# Patient Record
Sex: Female | Born: 1970
Health system: Southern US, Community
[De-identification: ages and names within clinical notes are randomized; demographics above are authoritative.]

## PROBLEM LIST (undated history)

## (undated) DIAGNOSIS — F419 Anxiety disorder, unspecified: Secondary | ICD-10-CM

## (undated) DIAGNOSIS — E119 Type 2 diabetes mellitus without complications: Secondary | ICD-10-CM

## (undated) DIAGNOSIS — E079 Disorder of thyroid, unspecified: Secondary | ICD-10-CM

## (undated) DIAGNOSIS — G43909 Migraine, unspecified, not intractable, without status migrainosus: Secondary | ICD-10-CM

## (undated) DIAGNOSIS — F32A Depression, unspecified: Secondary | ICD-10-CM

## (undated) DIAGNOSIS — M1990 Unspecified osteoarthritis, unspecified site: Secondary | ICD-10-CM

## (undated) DIAGNOSIS — B009 Herpesviral infection, unspecified: Secondary | ICD-10-CM

## (undated) DIAGNOSIS — F319 Bipolar disorder, unspecified: Secondary | ICD-10-CM

## (undated) DIAGNOSIS — K7460 Unspecified cirrhosis of liver: Secondary | ICD-10-CM

## (undated) DIAGNOSIS — J45909 Unspecified asthma, uncomplicated: Secondary | ICD-10-CM

## (undated) DIAGNOSIS — K7581 Nonalcoholic steatohepatitis (NASH): Secondary | ICD-10-CM

## (undated) DIAGNOSIS — F4323 Adjustment disorder with mixed anxiety and depressed mood: Secondary | ICD-10-CM

## (undated) HISTORY — PX: RIGHT OOPHORECTOMY: SHX2359

## (undated) HISTORY — DX: Unspecified asthma, uncomplicated: J45.909

## (undated) HISTORY — PX: OTHER SURGICAL HISTORY: SHX169

## (undated) HISTORY — DX: Migraine, unspecified, not intractable, without status migrainosus: G43.909

## (undated) HISTORY — PX: ABDOMINAL SURGERY: SHX537

## (undated) HISTORY — DX: Depression, unspecified: F32.A

## (undated) HISTORY — DX: Unspecified osteoarthritis, unspecified site: M19.90

## (undated) HISTORY — PX: CHOLECYSTECTOMY: SHX55

---

## 1998-04-07 ENCOUNTER — Emergency Department (HOSPITAL_COMMUNITY): Admission: EM | Admit: 1998-04-07 | Discharge: 1998-04-07 | Payer: Self-pay

## 1999-01-11 ENCOUNTER — Other Ambulatory Visit: Admission: RE | Admit: 1999-01-11 | Discharge: 1999-01-11 | Payer: Self-pay | Admitting: Obstetrics and Gynecology

## 1999-02-16 ENCOUNTER — Other Ambulatory Visit: Admission: RE | Admit: 1999-02-16 | Discharge: 1999-02-16 | Payer: Self-pay | Admitting: Obstetrics and Gynecology

## 1999-06-29 ENCOUNTER — Other Ambulatory Visit: Admission: RE | Admit: 1999-06-29 | Discharge: 1999-06-29 | Payer: Self-pay | Admitting: Obstetrics and Gynecology

## 1999-11-30 ENCOUNTER — Other Ambulatory Visit: Admission: RE | Admit: 1999-11-30 | Discharge: 1999-11-30 | Payer: Self-pay | Admitting: Obstetrics and Gynecology

## 2000-02-15 ENCOUNTER — Inpatient Hospital Stay (HOSPITAL_COMMUNITY): Admission: AD | Admit: 2000-02-15 | Discharge: 2000-02-15 | Payer: Self-pay | Admitting: Obstetrics and Gynecology

## 2000-02-20 ENCOUNTER — Inpatient Hospital Stay (HOSPITAL_COMMUNITY): Admission: AD | Admit: 2000-02-20 | Discharge: 2000-02-20 | Payer: Self-pay | Admitting: *Deleted

## 2000-03-14 ENCOUNTER — Inpatient Hospital Stay (HOSPITAL_COMMUNITY): Admission: AD | Admit: 2000-03-14 | Discharge: 2000-03-14 | Payer: Self-pay | Admitting: Obstetrics and Gynecology

## 2000-03-15 ENCOUNTER — Inpatient Hospital Stay (HOSPITAL_COMMUNITY): Admission: AD | Admit: 2000-03-15 | Discharge: 2000-03-15 | Payer: Self-pay | Admitting: Obstetrics and Gynecology

## 2000-03-22 ENCOUNTER — Inpatient Hospital Stay (HOSPITAL_COMMUNITY): Admission: AD | Admit: 2000-03-22 | Discharge: 2000-03-22 | Payer: Self-pay | Admitting: Obstetrics and Gynecology

## 2000-03-27 ENCOUNTER — Inpatient Hospital Stay (HOSPITAL_COMMUNITY): Admission: AD | Admit: 2000-03-27 | Discharge: 2000-04-02 | Payer: Self-pay | Admitting: Obstetrics and Gynecology

## 2000-03-27 ENCOUNTER — Encounter (INDEPENDENT_AMBULATORY_CARE_PROVIDER_SITE_OTHER): Payer: Self-pay | Admitting: Specialist

## 2000-03-28 ENCOUNTER — Encounter: Payer: Self-pay | Admitting: Obstetrics and Gynecology

## 2000-05-10 ENCOUNTER — Other Ambulatory Visit: Admission: RE | Admit: 2000-05-10 | Discharge: 2000-05-10 | Payer: Self-pay | Admitting: Obstetrics and Gynecology

## 2000-11-04 ENCOUNTER — Other Ambulatory Visit: Admission: RE | Admit: 2000-11-04 | Discharge: 2000-11-04 | Payer: Self-pay | Admitting: Obstetrics and Gynecology

## 2000-11-26 ENCOUNTER — Encounter: Admission: RE | Admit: 2000-11-26 | Discharge: 2001-02-24 | Payer: Self-pay | Admitting: Family Medicine

## 2003-09-12 ENCOUNTER — Emergency Department (HOSPITAL_COMMUNITY): Admission: EM | Admit: 2003-09-12 | Discharge: 2003-09-12 | Payer: Self-pay | Admitting: Emergency Medicine

## 2004-03-27 ENCOUNTER — Emergency Department (HOSPITAL_COMMUNITY): Admission: EM | Admit: 2004-03-27 | Discharge: 2004-03-28 | Payer: Self-pay | Admitting: Emergency Medicine

## 2004-03-30 ENCOUNTER — Emergency Department (HOSPITAL_COMMUNITY): Admission: EM | Admit: 2004-03-30 | Discharge: 2004-03-30 | Payer: Self-pay | Admitting: Emergency Medicine

## 2004-11-16 ENCOUNTER — Encounter: Admission: RE | Admit: 2004-11-16 | Discharge: 2004-11-16 | Payer: Self-pay | Admitting: General Surgery

## 2005-01-18 ENCOUNTER — Ambulatory Visit (HOSPITAL_COMMUNITY): Admission: RE | Admit: 2005-01-18 | Discharge: 2005-01-18 | Payer: Self-pay | Admitting: Obstetrics and Gynecology

## 2005-02-05 ENCOUNTER — Ambulatory Visit (HOSPITAL_COMMUNITY): Admission: RE | Admit: 2005-02-05 | Discharge: 2005-02-05 | Payer: Self-pay | Admitting: Obstetrics and Gynecology

## 2005-05-10 ENCOUNTER — Ambulatory Visit (HOSPITAL_COMMUNITY): Admission: RE | Admit: 2005-05-10 | Discharge: 2005-05-10 | Payer: Self-pay | Admitting: Obstetrics and Gynecology

## 2005-07-11 ENCOUNTER — Encounter: Admission: RE | Admit: 2005-07-11 | Discharge: 2005-07-11 | Payer: Self-pay | Admitting: Internal Medicine

## 2005-07-26 ENCOUNTER — Ambulatory Visit (HOSPITAL_COMMUNITY): Admission: RE | Admit: 2005-07-26 | Discharge: 2005-07-26 | Payer: Self-pay | Admitting: Obstetrics and Gynecology

## 2006-01-14 ENCOUNTER — Ambulatory Visit (HOSPITAL_COMMUNITY): Admission: RE | Admit: 2006-01-14 | Discharge: 2006-01-14 | Payer: Self-pay | Admitting: Obstetrics and Gynecology

## 2008-12-09 ENCOUNTER — Emergency Department (HOSPITAL_BASED_OUTPATIENT_CLINIC_OR_DEPARTMENT_OTHER): Admission: EM | Admit: 2008-12-09 | Discharge: 2008-12-09 | Payer: Self-pay | Admitting: Emergency Medicine

## 2008-12-09 ENCOUNTER — Ambulatory Visit: Payer: Self-pay | Admitting: Interventional Radiology

## 2009-07-23 ENCOUNTER — Emergency Department (HOSPITAL_BASED_OUTPATIENT_CLINIC_OR_DEPARTMENT_OTHER): Admission: EM | Admit: 2009-07-23 | Discharge: 2009-07-23 | Payer: Self-pay | Admitting: Emergency Medicine

## 2009-07-24 ENCOUNTER — Inpatient Hospital Stay (HOSPITAL_COMMUNITY): Admission: AD | Admit: 2009-07-24 | Discharge: 2009-07-27 | Payer: Self-pay | Admitting: Obstetrics & Gynecology

## 2009-07-24 ENCOUNTER — Other Ambulatory Visit: Payer: Self-pay | Admitting: Emergency Medicine

## 2010-11-26 ENCOUNTER — Encounter: Payer: Self-pay | Admitting: Obstetrics and Gynecology

## 2011-02-09 LAB — DIFFERENTIAL
Basophils Absolute: 0 10*3/uL (ref 0.0–0.1)
Basophils Relative: 0 % (ref 0–1)
Eosinophils Absolute: 0.2 10*3/uL (ref 0.0–0.7)
Lymphocytes Relative: 30 % (ref 12–46)
Lymphs Abs: 2.2 10*3/uL (ref 0.7–4.0)
Monocytes Absolute: 0.4 10*3/uL (ref 0.1–1.0)
Neutrophils Relative %: 65 % (ref 43–77)

## 2011-02-09 LAB — BASIC METABOLIC PANEL
CO2: 29 mEq/L (ref 19–32)
Chloride: 103 mEq/L (ref 96–112)
Sodium: 140 mEq/L (ref 135–145)

## 2011-02-09 LAB — GLUCOSE, CAPILLARY: Glucose-Capillary: 108 mg/dL — ABNORMAL HIGH (ref 70–99)

## 2011-02-09 LAB — CULTURE, BLOOD (ROUTINE X 2): Culture: NO GROWTH

## 2011-02-09 LAB — CBC
Hemoglobin: 12.6 g/dL (ref 12.0–15.0)
MCHC: 33.9 g/dL (ref 30.0–36.0)
MCV: 87.4 fL (ref 78.0–100.0)
RBC: 4.13 MIL/uL (ref 3.87–5.11)
RBC: 4.44 MIL/uL (ref 3.87–5.11)
WBC: 7.3 10*3/uL (ref 4.0–10.5)
WBC: 8.2 10*3/uL (ref 4.0–10.5)

## 2011-02-09 LAB — WET PREP, GENITAL: Trich, Wet Prep: NONE SEEN

## 2011-02-09 LAB — WOUND CULTURE

## 2011-02-09 LAB — GLUCOSE, RANDOM: Glucose, Bld: 173 mg/dL — ABNORMAL HIGH (ref 70–99)

## 2011-02-09 LAB — GC/CHLAMYDIA PROBE AMP, GENITAL: Chlamydia, DNA Probe: NEGATIVE

## 2011-03-23 NOTE — Op Note (Signed)
[facility]  Patient:    Nancy Mills                     MRN: [id number] Proc. Date: 03/30/00 Adm. Date:  [id number] Attending:  Maxie Better                           Operative Report  PREOPERATIVE DIAGNOSIS:       Intrauterine pregnancy at 47+ weeks gestational age, failure to progress, prolonged rupture of membranes.  POSTOPERATIVE DIAGNOSIS:      Intrauterine pregnancy at 40+ weeks gestational age, failure to progress, prolonged rupture of membranes.  OPERATION:                    Primary low transverse cesarean section.  SURGEON:                      Sung Amabile. Roslyn Smiling, M.D.  ASSISTANT:                    Cordelia Pen A. Rosalio Macadamia, M.D.  ANESTHESIA:                   Spinal.  ESTIMATED BLOOD LOSS:         800 cc.  TUBES AND DRAINS:             Foley.  COMPLICATIONS:                None.  FINDINGS:                     Live, vertex female, transverse position, 3085 grams with Apgars of 8 and 9.  Caput molded through 3 cm cervix.  Head out of pelvis.  Lower uterine segment very thin.  Uterus otherwise normal.  Normal tubes and ovaries.  Prolonged uterine atony was encountered after delivery.  This was managed with uterine massage, IV Pitocin, IM Methergine, and two doses of IM Hemabate.  Extension of the uterine incision measuring 3 cm at 5 oclock was repaired separately.  SPECIMENS:                    Cord bloods.  Arterial cord pH 7.31, and placenta to pathology.  INDICATIONS:                  A 40 year old woman, gravida 2, para 0-0-1-0, admitted at 36+ weeks gestational age after oligohydramnios was identified. The cervix was quite unfavorable.  Various methods to try to ripen the cervix were sed over her more than 48-hour stay on labor and delivery.  Membranes ruptured spontaneously at 8:30 p.m. on Mar 28, 2000.  Cervidil was used to try to ripen he cervix thereafter.  Pitocin was initiated on the afternoon of May 25.   Regular contractions were achieved on the morning of May 26.  Because of difficulty monitoring uterine contraction frequency, intrauterine pressure transducer was inserted.  An elevated uterine resting tone was identified while Pitocin was being given.  The Pitocin was stopped and the resting tone dropped to around 20 mmHg. It was restarted and the resting uterine tone rose again to 60 mmHg.  Labor was not adequate in terms of [place] units, however, with the unacceptably high elevation in uterine tone, the decision was made to proceed with cesarean section for delivery for arrest of labor progression.  The patient was consented and benefits,  risks, options, and expected outcomes were discussed prior to surgery.  Questions were answered and consent was obtained.  DESCRIPTION OF PROCEDURE:     After the establishment of adequate spinal anesthesia, the patient was placed in the left uterine displacement position. he abdomen was prepped, Foley catheter was placed, and the patient was draped.  A Pfannenstiel incision was made on the skin with a knife.  The incision was carried to the level of the fascia with a knife.  The fascia was nicked in the midline nd the fascial incision was carried laterally using scissors.  The fascia was separated from the underlying rectus muscle bellies with a combination of blunt and sharp dissection.  The rectus muscle bellies were split in the midline sharply.  The peritoneum was visualized, grasped in a clear space, and entered there bluntly. The peritoneal incision was carried superiorly and inferiorly taking care to avoid injury to underlying viscera.  The bladder blade was placed.  Uterine position as ascertained.  The lower uterine segment was very very thin.  The vesicouterine serosa was opened over the lower uterine segment transversely.  The bladder flap was developed bluntly and the bladder blade was placed in the bladder flap.  A low  transverse incision was made on the uterus with a knife.  The Metzenbaum scissors were used to complete the incision.  Bandage scissors were used to carry the incision laterally.  The head was delivered without difficulty and the nasopharynx suctioned.  The baby was delivered and the cord was clamped and cut. The baby was handed off the table to the awaiting pediatricians.  Cord bloods and arterial cord pH were taken.  The placenta was removed spontaneously.  Significant uterine atony was encountered.  The uterus was exteriorized, the bladder blade was replaced.  The uterine massage was being applied.  Increased IV Pitocin was given and Methergine was given.  The uterus as closed in layers.  The extension at 5 oclock was closed with a running interlocking stitch of 0 Monocryl.  The first layer of the main portion of the uterine incision was closed with a running interlocking stitch of 0 Monocryl and a second layer was closed with imbricating stitch of 0 Monocryl.  Small bleeding areas were controlled with figure-of-eight sutures of 0 Monocryl.  Massage continued.  Hemabate x 2 was given.  The uterus finally became firm and was replaced into the abdominal cavity.  Hemostasis was noted.  The peritoneal cavity was irrigated with warm normal saline.  Initial sponge, needle, and instrument counts were correct.  The anterior abdominal wall was closed in layers.  The subfascial layer was irrigated with warm normal saline.  Electrocautery was used for hemostasis.  The fascia was closed from either angle with a running stitch f 0 Vicryl tying in the midline independently.  The fat was irrigated with warm normal saline.  Electrocautery was used for hemostasis.  The skin was infiltrated with 20 cc of 0.25% Marcaine.  The skin was closed with staples.  Final sponge, needle, and instrument counts were correct.  The urine was clear at the end of the case. The patient was transported to the  recovery room in satisfactory condition. DD:  03/30/00 TD:  04/02/00 Job: [id number]

## 2011-03-23 NOTE — Discharge Summary (Signed)
[facility]  Patient:    Nancy Mills, Nancy Mills                     MRN: [id number] Adm. Date:  [id number] Disc. Date: [id number] Attending:  Maxie Better                           Discharge Summary  DISCHARGE DIAGNOSES:          1. Intrauterine pregnancy at term, delivered.                               2. Prolonged rupture of membranes.                               3. Failure to progress.                               4. Uterine atony.                               5. Postoperative anemia.                               6. History of hypothyroidism.                               7. Rh negative (RhoGAM given postpartum).  PROCEDURE:                    Primary low transverse cesarean section on Mar 30, 2000.  Pitocin augmentation.  Cervidil and prostaglandin gel.  HISTORY OF PRESENT ILLNESS:   A 40 year old woman, gravida 2, para 0-0-1-0, at [redacted] weeks gestational age admitted secondary to variable decellerations on tracing.  She was sent to [facility] [place] because of decreased fetal activity and bloody discharge.  On reviewing the tracing, it was unclear if the baseline was 155 with decellerations to 120.  Because of this and because of the issue of decreased fetal activity, the decision was made to admit and induce.  HOSPITAL COURSE:              Pitocin was initiated on Mar 28, 2000.  Little cervical change occurred and labor could not be established.  Cervical ripening was initiated on the night of May 24.  Pitocin was again initiated.  No labor occurred.  Cervidil was used to try to ripen the cervix. Rupture of membranes occurred and Clindamycin was initiated after more than 12 hours of rupture of membranes. High dose Pitocin was started late in the afternoon of May 25.  Intrauterine pressure transducer was inserted on the morning of May 26, to enhance the ability to contraction adequacy.  The cervix at that time was 2 to 3  cm.  Baseline uterine  tone was elevated.  Pitocin was stopped and contractions abated.  Pitocin was restarted and elevated tone was again noted.  Because of the inability to initiate adequate contraction pattern without increasing the uterine tone to an unacceptable level, the decision was made to proceed with cesarean delivery.  The patient was given consent and taken to the  operating room where she underwent primary low transverse cesarean section by Sung Amabile. Roslyn Smiling, M.D. and Cordelia Pen A. Rosalio Macadamia, M.D. under spinal anesthesia.  Estimated blood loss was 800 cc.  The  baby was a live vertex female, transverse position, 3085 grams with Apgars of 8 and 9.  The head was molded to the cervix.  The lower uterine segment was very thin. An extension of the incision at 5 oclock was repaired without difficulty. Arterial cord pH was 7.31 and the placenta was sent to pathology.  It later revealed immature placenta.  The patients postoperative course was remarkable for anemia.  Hemoglobin stabilized at 8.0.  This was well tolerated and treated conservatively.  RhoGAM  workup was performed.  The baby was O positive and the patient received RhoGAM.  Diet was advanced without difficulty and surgical staples were removed prior to  discharge.  She was given routine status post cesarean section instructions.  DISCHARGE MEDICATIONS:        1. Prenatal vitamins.                               2. Tylox tablets.                               3. Nonsteroidal anti-inflammatory agents.  FOLLOW-UP:                    She will be followed up in the office in four to ix weeks. DD:  05/03/00 TD:  05/04/00 Job: [id number]

## 2011-05-26 ENCOUNTER — Emergency Department (HOSPITAL_COMMUNITY): Payer: Medicaid Other

## 2011-05-26 ENCOUNTER — Emergency Department (HOSPITAL_COMMUNITY)
Admission: EM | Admit: 2011-05-26 | Discharge: 2011-05-26 | Disposition: A | Discharge status: Home / Self Care | Payer: Medicaid Other | Attending: Emergency Medicine | Admitting: Emergency Medicine

## 2011-05-26 DIAGNOSIS — R072 Precordial pain: Secondary | ICD-10-CM | POA: Insufficient documentation

## 2011-05-26 DIAGNOSIS — T190XXA Foreign body in urethra, initial encounter: Secondary | ICD-10-CM | POA: Insufficient documentation

## 2011-05-26 DIAGNOSIS — F319 Bipolar disorder, unspecified: Secondary | ICD-10-CM | POA: Insufficient documentation

## 2011-05-26 DIAGNOSIS — E669 Obesity, unspecified: Secondary | ICD-10-CM | POA: Insufficient documentation

## 2011-05-26 DIAGNOSIS — M546 Pain in thoracic spine: Secondary | ICD-10-CM | POA: Insufficient documentation

## 2011-05-26 DIAGNOSIS — E039 Hypothyroidism, unspecified: Secondary | ICD-10-CM | POA: Insufficient documentation

## 2012-10-24 ENCOUNTER — Encounter (HOSPITAL_BASED_OUTPATIENT_CLINIC_OR_DEPARTMENT_OTHER): Payer: Self-pay | Admitting: Family Medicine

## 2012-10-24 ENCOUNTER — Emergency Department (HOSPITAL_BASED_OUTPATIENT_CLINIC_OR_DEPARTMENT_OTHER)
Admission: EM | Admit: 2012-10-24 | Discharge: 2012-10-24 | Disposition: A | Discharge status: Home / Self Care | Payer: BC Managed Care – PPO | Attending: Emergency Medicine | Admitting: Emergency Medicine

## 2012-10-24 DIAGNOSIS — R6883 Chills (without fever): Secondary | ICD-10-CM | POA: Insufficient documentation

## 2012-10-24 DIAGNOSIS — K5289 Other specified noninfective gastroenteritis and colitis: Secondary | ICD-10-CM | POA: Insufficient documentation

## 2012-10-24 DIAGNOSIS — R1110 Vomiting, unspecified: Secondary | ICD-10-CM | POA: Insufficient documentation

## 2012-10-24 DIAGNOSIS — E079 Disorder of thyroid, unspecified: Secondary | ICD-10-CM | POA: Insufficient documentation

## 2012-10-24 DIAGNOSIS — F411 Generalized anxiety disorder: Secondary | ICD-10-CM | POA: Insufficient documentation

## 2012-10-24 DIAGNOSIS — Z79899 Other long term (current) drug therapy: Secondary | ICD-10-CM | POA: Insufficient documentation

## 2012-10-24 DIAGNOSIS — K529 Noninfective gastroenteritis and colitis, unspecified: Secondary | ICD-10-CM

## 2012-10-24 DIAGNOSIS — B009 Herpesviral infection, unspecified: Secondary | ICD-10-CM | POA: Insufficient documentation

## 2012-10-24 DIAGNOSIS — Z87891 Personal history of nicotine dependence: Secondary | ICD-10-CM | POA: Insufficient documentation

## 2012-10-24 DIAGNOSIS — F319 Bipolar disorder, unspecified: Secondary | ICD-10-CM | POA: Insufficient documentation

## 2012-10-24 HISTORY — DX: Disorder of thyroid, unspecified: E07.9

## 2012-10-24 HISTORY — DX: Anxiety disorder, unspecified: F41.9

## 2012-10-24 HISTORY — DX: Herpesviral infection, unspecified: B00.9

## 2012-10-24 HISTORY — DX: Bipolar disorder, unspecified: F31.9

## 2012-10-24 MED ORDER — ONDANSETRON HCL 4 MG PO TABS: 4.0000 mg | ORAL_TABLET | Freq: Four times a day (QID) | ORAL | Status: DC

## 2012-10-24 MED ORDER — ONDANSETRON HCL 4 MG/2ML IJ SOLN
4.0000 mg | Freq: Once | INTRAMUSCULAR | Status: AC
  Administered 2012-10-24: 4 mg via INTRAVENOUS
  Filled 2012-10-24: qty 2

## 2012-10-24 MED ORDER — SODIUM CHLORIDE 0.9 % IV BOLUS (SEPSIS)
1000.0000 mL | Freq: Once | INTRAVENOUS | Status: AC
  Administered 2012-10-24: 1000 mL via INTRAVENOUS

## 2012-10-24 NOTE — ED Notes (Signed)
Hold tubes for labs drawn

## 2012-10-24 NOTE — ED Notes (Signed)
Pt c/o n/v/d since 5pm yesterday. Husband sick prior. Denies pain, fever.

## 2012-10-24 NOTE — ED Provider Notes (Addendum)
History     CSN: [id number]  Arrival date & time 10/24/12  1191   First MD Initiated Contact with Patient 10/24/12 [phone]      Chief Complaint  Patient presents with  . Emesis  . Diarrhea    (Consider location/radiation/quality/duration/timing/severity/associated sxs/prior treatment) Patient is a 41 y.o. female presenting with vomiting and diarrhea. The history is provided by the patient.  Emesis  This is a new problem. The current episode started 12 to 24 hours ago. The problem occurs more than 10 times per day. The problem has not changed since onset.The emesis has an appearance of stomach contents. There has been no fever. Associated symptoms include chills and diarrhea. Pertinent negatives include no abdominal pain, no cough, no fever and no URI. Risk factors include ill contacts (husband with same 1 day ago).  Diarrhea The primary symptoms include vomiting and diarrhea. Primary symptoms do not include fever, abdominal pain or dysuria.  The illness is also significant for chills.    Past Medical History  Diagnosis Date  . Bipolar 1 disorder   . Thyroid disease   . HSV-2 infection   . Anxiety     Past Surgical History  Procedure Date  . Abdominal surgery   . Abdomnal wall reconstruction   . Cholecystectomy   . Right oophorectomy     No family history on file.  History  Substance Use Topics  . Smoking status: Former Games developer  . Smokeless tobacco: Not on file  . Alcohol Use: Yes    OB History    Grav Para Term Preterm Abortions TAB SAB Ect Mult Living                  Review of Systems  Constitutional: Positive for chills. Negative for fever.  Respiratory: Negative for cough and shortness of breath.   Gastrointestinal: Positive for vomiting and diarrhea. Negative for abdominal pain and blood in stool.  Genitourinary: Negative for dysuria.  All other systems reviewed and are negative.    Allergies  Codeine; Erythromycin; and Penicillins  Home  Medications   Current Outpatient Rx  Name  Route  Sig  Dispense  Refill  . ALBUTEROL SULFATE HFA 108 (90 BASE) MCG/ACT IN AERS   Inhalation   Inhale 2 puffs into the lungs every 6 (six) hours as needed.         . ALPRAZOLAM 0.5 MG PO TABS   Oral   Take 0.5 mg by mouth 3 (three) times daily as needed.         Marland Kitchen LAMOTRIGINE 200 MG PO TABS   Oral   Take 300 mg by mouth daily.         Marland Kitchen LEVOTHYROXINE SODIUM 125 MCG PO CAPS   Oral   Take by mouth.         Marland Kitchen VALACYCLOVIR HCL 500 MG PO TABS   Oral   Take 1,000 mg by mouth daily.           BP 123/68  Pulse 100  Temp 98.5 F (36.9 C) (Oral)  Resp 14  Ht 5' 6.75" (1.695 m)  Wt 313 lb (141.976 kg)  BMI 49.39 kg/m2  SpO2 99%  Physical Exam  Nursing note and vitals reviewed. Constitutional: She is oriented to person, place, and time. She appears well-developed and well-nourished. No distress.  HENT:  Head: Normocephalic and atraumatic.  Mouth/Throat: Oropharynx is clear and moist.  Eyes: Conjunctivae normal and EOM are normal. Pupils are equal, round, and reactive  to light.  Neck: Normal range of motion. Neck supple.  Cardiovascular: Regular rhythm and intact distal pulses.  Tachycardia present.   No murmur heard. Pulmonary/Chest: Effort normal and breath sounds normal. No respiratory distress. She has no wheezes. She has no rales.  Abdominal: Soft. She exhibits no distension. There is no tenderness. There is no rebound and no guarding.       Multiple healed abd scars  Musculoskeletal: Normal range of motion. She exhibits no edema and no tenderness.  Neurological: She is alert and oriented to person, place, and time.  Skin: Skin is warm and dry. No rash noted. No erythema.  Psychiatric: She has a normal mood and affect. Her behavior is normal.    ED Course  Procedures (including critical care time)  Labs Reviewed - No data to display No results found.   1. Gastroenteritis       MDM   Pt with symptoms  most consistent with a viral process with fever/vomitting/diarrhea.  Denies bad food exposure and recent travel out of the country.  No recent abx.  No hx concerning for GU pathology or kidney stones.  Pt is awake and alert on exam without peritoneal signs.  9:14 AM Pt feeling much better and d/ced home.       Gwyneth Sprout, MD 10/24/12 [id number]  Gwyneth Sprout, MD 10/24/12 1308

## 2016-02-09 DIAGNOSIS — E042 Nontoxic multinodular goiter: Secondary | ICD-10-CM | POA: Insufficient documentation

## 2016-02-09 DIAGNOSIS — E039 Hypothyroidism, unspecified: Secondary | ICD-10-CM | POA: Insufficient documentation

## 2016-04-24 ENCOUNTER — Other Ambulatory Visit: Payer: Self-pay | Admitting: Physician Assistant

## 2016-04-24 DIAGNOSIS — R1030 Lower abdominal pain, unspecified: Secondary | ICD-10-CM

## 2016-04-30 ENCOUNTER — Other Ambulatory Visit: Payer: Medicaid Other

## 2016-05-09 DIAGNOSIS — K7460 Unspecified cirrhosis of liver: Secondary | ICD-10-CM | POA: Insufficient documentation

## 2016-05-09 DIAGNOSIS — K580 Irritable bowel syndrome with diarrhea: Secondary | ICD-10-CM | POA: Insufficient documentation

## 2016-05-09 DIAGNOSIS — K766 Portal hypertension: Secondary | ICD-10-CM | POA: Insufficient documentation

## 2017-01-01 ENCOUNTER — Emergency Department (HOSPITAL_BASED_OUTPATIENT_CLINIC_OR_DEPARTMENT_OTHER): Payer: PRIVATE HEALTH INSURANCE

## 2017-01-01 ENCOUNTER — Emergency Department (HOSPITAL_BASED_OUTPATIENT_CLINIC_OR_DEPARTMENT_OTHER)
Admission: EM | Admit: 2017-01-01 | Discharge: 2017-01-02 | Discharge status: Against Medical Advice | Payer: PRIVATE HEALTH INSURANCE | Attending: Emergency Medicine | Admitting: Emergency Medicine

## 2017-01-01 ENCOUNTER — Encounter (HOSPITAL_BASED_OUTPATIENT_CLINIC_OR_DEPARTMENT_OTHER): Payer: Self-pay | Admitting: Emergency Medicine

## 2017-01-01 DIAGNOSIS — Z79899 Other long term (current) drug therapy: Secondary | ICD-10-CM | POA: Insufficient documentation

## 2017-01-01 DIAGNOSIS — Z794 Long term (current) use of insulin: Secondary | ICD-10-CM | POA: Diagnosis not present

## 2017-01-01 DIAGNOSIS — R079 Chest pain, unspecified: Secondary | ICD-10-CM

## 2017-01-01 DIAGNOSIS — F17200 Nicotine dependence, unspecified, uncomplicated: Secondary | ICD-10-CM | POA: Diagnosis not present

## 2017-01-01 DIAGNOSIS — R51 Headache: Secondary | ICD-10-CM | POA: Insufficient documentation

## 2017-01-01 DIAGNOSIS — E119 Type 2 diabetes mellitus without complications: Secondary | ICD-10-CM | POA: Insufficient documentation

## 2017-01-01 DIAGNOSIS — R0602 Shortness of breath: Secondary | ICD-10-CM | POA: Insufficient documentation

## 2017-01-01 DIAGNOSIS — R072 Precordial pain: Secondary | ICD-10-CM | POA: Diagnosis not present

## 2017-01-01 HISTORY — DX: Morbid (severe) obesity due to excess calories: E66.01

## 2017-01-01 HISTORY — DX: Type 2 diabetes mellitus without complications: E11.9

## 2017-01-01 HISTORY — DX: Nonalcoholic steatohepatitis (NASH): K75.81

## 2017-01-01 HISTORY — DX: Adjustment disorder with mixed anxiety and depressed mood: F43.23

## 2017-01-01 HISTORY — DX: Unspecified cirrhosis of liver: K74.60

## 2017-01-01 LAB — CBC WITH DIFFERENTIAL/PLATELET
BASOS PCT: 0 %
Basophils Absolute: 0 10*3/uL (ref 0.0–0.1)
Eosinophils Absolute: 0.2 10*3/uL (ref 0.0–0.7)
Eosinophils Relative: 2 %
HEMATOCRIT: 38.5 % (ref 36.0–46.0)
Hemoglobin: 13 g/dL (ref 12.0–15.0)
LYMPHS PCT: 33 %
Lymphs Abs: 3.2 10*3/uL (ref 0.7–4.0)
MCH: 28.3 pg (ref 26.0–34.0)
MCHC: 33.8 g/dL (ref 30.0–36.0)
MCV: 83.9 fL (ref 78.0–100.0)
MONOS PCT: 6 %
Monocytes Absolute: 0.6 10*3/uL (ref 0.1–1.0)
NEUTROS ABS: 5.8 10*3/uL (ref 1.7–7.7)
NEUTROS PCT: 59 %
Platelets: 211 10*3/uL (ref 150–400)
RBC: 4.59 MIL/uL (ref 3.87–5.11)
RDW: 14.7 % (ref 11.5–15.5)
WBC: 9.7 10*3/uL (ref 4.0–10.5)

## 2017-01-01 MED ORDER — KETOROLAC TROMETHAMINE 30 MG/ML IJ SOLN: 30.0000 mg | Freq: Once | INTRAMUSCULAR | Status: DC

## 2017-01-01 MED ORDER — GI COCKTAIL ~~LOC~~
30.0000 mL | Freq: Once | ORAL | Status: AC
  Administered 2017-01-01: 30 mL via ORAL
  Filled 2017-01-01: qty 30

## 2017-01-01 NOTE — ED Provider Notes (Signed)
[facility] DEPT [facility] Provider Note   CSN: [id number] Arrival date & time: 01/01/17  2302   By signing my name below, I, Eunice Blase, attest that this documentation has been prepared under the direction and in the presence of Tjay Velazquez, MD. Electronically signed, Eunice Blase, ED Scribe. 01/01/17. 11:48 PM.   History   Chief Complaint Chief Complaint  Patient presents with  . Chest Pain  . Shortness of Breath   The history is provided by the patient and medical records. No language interpreter was used.  Chest Pain   This is a recurrent problem. The current episode started 3 to 5 hours ago. The problem occurs constantly. The problem has not changed since onset.The pain is associated with rest. The pain is present in the substernal region. The pain is moderate. The quality of the pain is described as burning. Associated symptoms include headaches. Pertinent negatives include no abdominal pain, no diaphoresis, no fever, no numbness, no palpitations, no shortness of breath, no vomiting and no weakness. Cough: dry. Risk factors include obesity.  Pertinent negatives for past medical history include no Marfan's syndrome.  Pertinent negatives for family medical history include: no early MI.  Procedure history is negative for stress echo.    HPI Comments: Nancy Mills is a 46 y.o. female who presents to the Emergency Department complaining of persistent chest pain x 4 hours. She also notes that her pain is exacerbated when leaning backward. She adds associated headache and facial pain. She states she was watching television at onset after eating spaghetti this evening. She notes Hx of similar relieved with xanax. Pt notes she has taken xanax today without relief. Last normal BM today. No leg pain or swelling. No long car trips or plane trips.    Past Medical History:  Diagnosis Date  . Adjustment disorder with mixed anxiety and depressed mood   . Anxiety   . Bipolar 1 disorder  ([place])   . Diabetes mellitus without complication ([place])   . HSV-2 infection   . Liver cirrhosis secondary to NASH ([place])   . Severe obesity ([place])   . Thyroid disease     There are no active problems to display for this patient.   Past Surgical History:  Procedure Laterality Date  . ABDOMINAL SURGERY    . abdomnal wall reconstruction    . CHOLECYSTECTOMY    . RIGHT OOPHORECTOMY      OB History    No data available       Home Medications    Prior to Admission medications   Medication Sig Start Date End Date Taking? Authorizing Provider  albuterol (PROVENTIL HFA;VENTOLIN HFA) 108 (90 BASE) MCG/ACT inhaler Inhale 2 puffs into the lungs every 6 (six) hours as needed.    Historical Provider, MD  ALPRAZolam Duanne Moron) 0.5 MG tablet Take 0.5 mg by mouth 3 (three) times daily as needed.    Historical Provider, MD  lamoTRIgine (LAMICTAL) 200 MG tablet Take 300 mg by mouth daily.    Historical Provider, MD  Levothyroxine Sodium 125 MCG CAPS Take by mouth.    Historical Provider, MD  ondansetron (ZOFRAN) 4 MG tablet Take 1 tablet (4 mg total) by mouth every 6 (six) hours. 10/24/12   Blanchie Dessert, MD  valACYclovir (VALTREX) 500 MG tablet Take 1,000 mg by mouth daily.    Historical Provider, MD    Family History History reviewed. No pertinent family history.  Social History Social History  Substance Use Topics  . Smoking status:  Current Every Day Smoker    Packs/day: 0.50    Years: 20.00  . Smokeless tobacco: Never Used  . Alcohol use Yes     Allergies   Latex; Clindamycin/lincomycin; Codeine; Erythromycin; and Penicillins   Review of Systems Review of Systems  Constitutional: Negative for chills, diaphoresis and fever.  HENT: Negative for facial swelling and sore throat.   Respiratory: Negative for chest tightness, shortness of breath and stridor. Cough: dry.   Cardiovascular: Positive for chest pain. Negative for palpitations and leg swelling.  Gastrointestinal:  Negative for abdominal pain and vomiting.  Genitourinary: Negative for dysuria.  Musculoskeletal: Negative for neck stiffness.  Skin: Negative for rash and wound.  Neurological: Positive for headaches. Negative for tremors, syncope, weakness and numbness.  Psychiatric/Behavioral: Negative for confusion. The patient is not nervous/anxious.   All other systems reviewed and are negative.    Physical Exam Updated Vital Signs BP 127/88 (BP Location: Left Arm)   Pulse 74   Temp 98.7 F (37.1 C) (Oral)   Resp 16   Ht 5' 7"  (1.702 m)   Wt 289 lb (131.1 kg)   SpO2 99%   BMI 45.26 kg/m   Physical Exam  Constitutional: She is oriented to person, place, and time. She appears well-developed and well-nourished. No distress.  HENT:  Right Ear: External ear normal.  Left Ear: External ear normal.  Mouth/Throat: Oropharynx is clear and moist.  Clear colored post-nasal drip  Eyes: Conjunctivae and EOM are normal. Pupils are equal, round, and reactive to light. No scleral icterus.  Neck: Normal range of motion. Neck supple. No JVD present.  Cardiovascular: Normal rate, regular rhythm, normal heart sounds and intact distal pulses.   Pulmonary/Chest: Effort normal and breath sounds normal. No accessory muscle usage or stridor. No respiratory distress. She has no decreased breath sounds. She has no wheezes. She has no rhonchi. She has no rales. She exhibits no tenderness.  Abdominal: Soft. She exhibits no abdominal bruit and no mass. There is no tenderness. There is no rebound and no guarding.  Bowel sounds into the thoracic cavity; gassy;  Musculoskeletal: Normal range of motion. She exhibits no edema, tenderness or deformity.  All compartments are soft  Lymphadenopathy:    She has no cervical adenopathy.  Neurological: She is alert and oriented to person, place, and time. She displays normal reflexes.  Skin: Skin is warm and dry. Capillary refill takes less than 2 seconds. She is not  diaphoretic.  Psychiatric: Her mood appears anxious.   ED Treatments / Results   Vitals:   01/01/17 2314  BP: 127/88  Pulse: 74  Resp: 16  Temp: 98.7 F (37.1 C)    DIAGNOSTIC STUDIES: Oxygen Saturation is 99% on RA, normal by my interpretation.     Results for orders placed or performed during the hospital encounter of 01/01/17  CBC with Differential/Platelet  Result Value Ref Range   WBC 9.7 4.0 - 10.5 K/uL   RBC 4.59 3.87 - 5.11 MIL/uL   Hemoglobin 13.0 12.0 - 15.0 g/dL   HCT 38.5 36.0 - 46.0 %   MCV 83.9 78.0 - 100.0 fL   MCH 28.3 26.0 - 34.0 pg   MCHC 33.8 30.0 - 36.0 g/dL   RDW 14.7 11.5 - 15.5 %   Platelets 211 150 - 400 K/uL   Neutrophils Relative % 59 %   Neutro Abs 5.8 1.7 - 7.7 K/uL   Lymphocytes Relative 33 %   Lymphs Abs 3.2 0.7 - 4.0 K/uL  Monocytes Relative 6 %   Monocytes Absolute 0.6 0.1 - 1.0 K/uL   Eosinophils Relative 2 %   Eosinophils Absolute 0.2 0.0 - 0.7 K/uL   Basophils Relative 0 %   Basophils Absolute 0.0 0.0 - 0.1 K/uL  Basic metabolic panel  Result Value Ref Range   Sodium 133 (L) 135 - 145 mmol/L   Potassium 3.6 3.5 - 5.1 mmol/L   Chloride 101 101 - 111 mmol/L   CO2 23 22 - 32 mmol/L   Glucose, Bld 397 (H) 65 - 99 mg/dL   BUN 7 6 - 20 mg/dL   Creatinine, Ser 0.64 0.44 - 1.00 mg/dL   Calcium 8.8 (L) 8.9 - 10.3 mg/dL   GFR calc non Af Amer >60 >60 mL/min   GFR calc Af Amer >60 >60 mL/min   Anion gap 9 5 - 15  Troponin I  Result Value Ref Range   Troponin I <0.03 <0.03 ng/mL   No results found.  EKG  EKG Interpretation  Date/Time:  Tuesday January 01 2017 23:10:36 EST Ventricular Rate:  76 PR Interval:  150 QRS Duration: 80 QT Interval:  388 QTC Calculation: 436 R Axis:   4 Text Interpretation:  Normal sinus rhythm Confirmed by [facility]  MD, Ladan Vanderzanden ([id number]) on 01/01/2017 11:14:29 PM       Procedures Procedures (including critical care time)  Medications Ordered in ED  Medications  ketorolac (TORADOL) 30  MG/ML injection 30 mg (30 mg Intravenous Not Given 01/01/17 2340)  gi cocktail (Maalox,Lidocaine,Donnatal) (30 mLs Oral Given 01/01/17 2340)      PERC negative wells 0 highly doubt PE in this low risk patient.    Final Clinical Impressions(s) / ED Diagnoses  Chest pain:  While I suspect this is GERD EDP immediately told the patient that she required two sets of cardiac enzymes in order to exclude ACS. Patient was amenable to this plan.  EDP went to see another patient and EDP was informed by nurse that patient did not want a cardiac work up and wanted to [facility].  EDP spoke to the patient who again agreed to cardiac work up.  Patient reportedly refused chest XRAY and then decided to [facility] following initial blood work. Patient stated she felt reassured by initial blood work.  Patient stated pain was gone and she did not want to stay.  EDP stated again to the patient that it takes 2 sets of blood work to rule out acute coronary syndrome and given her diabetes this was imperative.  Patient stated she wanted to [facility].  She is of sound mind and has decision making capacity to refuse care.  The risks of signing AMA are but are not limited to: death, coma, heart attack causing cardiomyopathy, congestive heart failure and prolonged morbidity.  Patient understands these risks and signs AMA.  She is welcomed to return at any time and should follow up immediately with her family physician.   New Prescriptions New Prescriptions   No medications on file     Ariabella Brien, MD 01/02/17 [phone]

## 2017-01-01 NOTE — ED Triage Notes (Addendum)
Pt with chest pressure and SHOB x 3 hours states she has been nauseated last 2 days. Reports nonproductive cough x 2 weeks

## 2017-01-02 ENCOUNTER — Encounter (HOSPITAL_BASED_OUTPATIENT_CLINIC_OR_DEPARTMENT_OTHER): Payer: Self-pay | Admitting: Emergency Medicine

## 2017-01-02 LAB — BASIC METABOLIC PANEL
Anion gap: 9 (ref 5–15)
BUN: 7 mg/dL (ref 6–20)
CALCIUM: 8.8 mg/dL — AB (ref 8.9–10.3)
CO2: 23 mmol/L (ref 22–32)
CREATININE: 0.64 mg/dL (ref 0.44–1.00)
Chloride: 101 mmol/L (ref 101–111)
GFR calc Af Amer: >60 mL/min (ref >60)
GFR calc non Af Amer: >60 mL/min (ref >60)
GLUCOSE: 397 mg/dL — AB (ref 65–99)
Potassium: 3.6 mmol/L (ref 3.5–5.1)
Sodium: 133 mmol/L — ABNORMAL LOW (ref 135–145)

## 2017-01-02 LAB — TROPONIN I: Troponin I: <0.03 ng/mL (ref <0.03)

## 2017-03-18 ENCOUNTER — Emergency Department (HOSPITAL_BASED_OUTPATIENT_CLINIC_OR_DEPARTMENT_OTHER)
Admission: EM | Admit: 2017-03-18 | Discharge: 2017-03-18 | Disposition: A | Discharge status: Home / Self Care | Payer: No Typology Code available for payment source | Attending: Emergency Medicine | Admitting: Emergency Medicine

## 2017-03-18 ENCOUNTER — Encounter (HOSPITAL_BASED_OUTPATIENT_CLINIC_OR_DEPARTMENT_OTHER): Payer: Self-pay

## 2017-03-18 ENCOUNTER — Emergency Department (HOSPITAL_BASED_OUTPATIENT_CLINIC_OR_DEPARTMENT_OTHER): Payer: No Typology Code available for payment source

## 2017-03-18 DIAGNOSIS — Y999 Unspecified external cause status: Secondary | ICD-10-CM | POA: Insufficient documentation

## 2017-03-18 DIAGNOSIS — F17200 Nicotine dependence, unspecified, uncomplicated: Secondary | ICD-10-CM | POA: Insufficient documentation

## 2017-03-18 DIAGNOSIS — Z794 Long term (current) use of insulin: Secondary | ICD-10-CM | POA: Insufficient documentation

## 2017-03-18 DIAGNOSIS — S39012A Strain of muscle, fascia and tendon of lower back, initial encounter: Secondary | ICD-10-CM

## 2017-03-18 DIAGNOSIS — E119 Type 2 diabetes mellitus without complications: Secondary | ICD-10-CM | POA: Diagnosis not present

## 2017-03-18 DIAGNOSIS — Y939 Activity, unspecified: Secondary | ICD-10-CM | POA: Insufficient documentation

## 2017-03-18 DIAGNOSIS — Y92410 Unspecified street and highway as the place of occurrence of the external cause: Secondary | ICD-10-CM | POA: Insufficient documentation

## 2017-03-18 DIAGNOSIS — S20212A Contusion of left front wall of thorax, initial encounter: Secondary | ICD-10-CM | POA: Diagnosis not present

## 2017-03-18 DIAGNOSIS — S161XXA Strain of muscle, fascia and tendon at neck level, initial encounter: Secondary | ICD-10-CM | POA: Diagnosis not present

## 2017-03-18 DIAGNOSIS — S3992XA Unspecified injury of lower back, initial encounter: Secondary | ICD-10-CM | POA: Diagnosis present

## 2017-03-18 LAB — PREGNANCY, URINE: Preg Test, Ur: NEGATIVE

## 2017-03-18 MED ORDER — METHOCARBAMOL 500 MG PO TABS: 500.0000 mg | ORAL_TABLET | Freq: Two times a day (BID) | ORAL | 0 refills | Status: DC

## 2017-03-18 MED ORDER — KETOROLAC TROMETHAMINE 60 MG/2ML IM SOLN
30.0000 mg | Freq: Once | INTRAMUSCULAR | Status: AC
  Administered 2017-03-18: 30 mg via INTRAMUSCULAR
  Filled 2017-03-18: qty 2

## 2017-03-18 MED ORDER — NAPROXEN 500 MG PO TABS: 500.0000 mg | ORAL_TABLET | Freq: Two times a day (BID) | ORAL | 0 refills | Status: DC

## 2017-03-18 NOTE — ED Notes (Signed)
Alert, NAD, calm, using smart phone, family at [facility], "feel better", no changes.

## 2017-03-18 NOTE — ED Notes (Signed)
Alert, NAD, calm, interactive, resps e/u, speaking in clear complete sentences, no dyspnea noted, skin W&D, c/o muscular back pain, (denies: sob, nausea, dizziness or visual changes). Up to b/r with steady gait.

## 2017-03-18 NOTE — ED Notes (Signed)
Pt alert, NAD, calm, interactive, steady gait to xray. Family remains in room.

## 2017-03-18 NOTE — Discharge Instructions (Signed)
Try ice and/ or heat. Take naproxen for pain. Take Robaxin for muscle spasms. Follow-up with family doctor as needed.
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
## 2017-05-21 DIAGNOSIS — B009 Herpesviral infection, unspecified: Secondary | ICD-10-CM | POA: Insufficient documentation

## 2018-03-27 DIAGNOSIS — F319 Bipolar disorder, unspecified: Secondary | ICD-10-CM | POA: Insufficient documentation

## 2018-12-03 DIAGNOSIS — R159 Full incontinence of feces: Secondary | ICD-10-CM | POA: Insufficient documentation

## 2019-03-05 DIAGNOSIS — M5416 Radiculopathy, lumbar region: Secondary | ICD-10-CM | POA: Insufficient documentation

## 2019-03-05 DIAGNOSIS — M48061 Spinal stenosis, lumbar region without neurogenic claudication: Secondary | ICD-10-CM | POA: Insufficient documentation

## 2019-10-13 ENCOUNTER — Ambulatory Visit (INDEPENDENT_AMBULATORY_CARE_PROVIDER_SITE_OTHER): Payer: PRIVATE HEALTH INSURANCE | Admitting: Physician Assistant

## 2019-10-13 ENCOUNTER — Encounter: Payer: Self-pay | Admitting: Physician Assistant

## 2019-10-13 VITALS — BP 133/76 | HR 97 | Temp 98.1°F | Wt 223.9 lb

## 2019-10-13 DIAGNOSIS — K047 Periapical abscess without sinus: Secondary | ICD-10-CM

## 2019-10-13 MED ORDER — METRONIDAZOLE 500 MG PO TABS: 500.0000 mg | ORAL_TABLET | Freq: Three times a day (TID) | ORAL | 0 refills | Status: AC

## 2019-10-13 MED ORDER — CEPHALEXIN 500 MG PO CAPS: 500.0000 mg | ORAL_CAPSULE | Freq: Two times a day (BID) | ORAL | 0 refills | Status: DC

## 2019-10-13 NOTE — Progress Notes (Signed)
HPI:                                                                Nancy Mills is a 48 y.o. female who presents to [facility] today for dental abscess   Pleasant 63 YOF with PMH cirrhosis 2/2 NASH, hx of DM2, s/p gastric bypass, tobacco use presents with new onset swelling of left cheek that progressed to left-sided dental pain over the last several weeks. She states she saw a snow-pea sized abscess above her left first pre-molar, which is also fractured She has chronic dental problems, dry mouth and periodontal disease She reports the abscess ruptured 2 days ago. After the rupture the facial swelling/tenderness improved. Denies fever. Has been doing salt water gargles Has contacted her dentist multiple times and unable to be seen Hx of MRSA infection in 2010   Past Medical History:  Diagnosis Date  . Adjustment disorder with mixed anxiety and depressed mood   . Anxiety   . Bipolar 1 disorder ([place])   . Diabetes mellitus without complication ([place])   . HSV-2 infection   . Liver cirrhosis secondary to NASH ([place])   . Severe obesity ([place])   . Thyroid disease    Past Surgical History:  Procedure Laterality Date  . ABDOMINAL SURGERY    . abdomnal wall reconstruction    . CHOLECYSTECTOMY    . RIGHT OOPHORECTOMY     Social History   Tobacco Use  . Smoking status: Current Every Day Smoker    Packs/day: 0.50    Years: 20.00    Pack years: 10.00  . Smokeless tobacco: Never Used  Substance Use Topics  . Alcohol use: No   family history is not on file.    ROS: negative except as noted in the HPI  Medications: Current Outpatient Medications  Medication Sig Dispense Refill  . albuterol (PROVENTIL HFA;VENTOLIN HFA) 108 (90 BASE) MCG/ACT inhaler Inhale 2 puffs into the lungs every 6 (six) hours as needed.    . ALPRAZolam (XANAX) 0.5 MG tablet Take 0.5 mg by mouth 3 (three) times daily as needed.    . cyclobenzaprine  (FLEXERIL) 5 MG tablet Take 5 mg by mouth 3 (three) times daily as needed.    . lamoTRIgine (LAMICTAL) 200 MG tablet Take 300 mg by mouth daily.    . Levothyroxine Sodium 125 MCG CAPS Take by mouth.    . Multiple Vitamins-Minerals (OPURITY BYPASS OPTIMIZED) CHEW Chew by mouth.    Marland Kitchen omeprazole (PRILOSEC) 20 MG capsule TAKE 1 CAPSULE BY MOUTH DAILY.    Marland Kitchen ondansetron (ZOFRAN) 4 MG tablet Take 1 tablet (4 mg total) by mouth every 6 (six) hours. 12 tablet 0  . PREVIDENT 5000 BOOSTER PLUS 1.1 % PSTE     . senna-docusate (SENOKOT-S) 8.6-50 MG tablet Take by mouth.    . valACYclovir (VALTREX) 500 MG tablet Take 1,000 mg by mouth daily.    . insulin glargine (LANTUS) 100 UNIT/ML injection Inject 45 Units into the skin at bedtime.    . methocarbamol (ROBAXIN) 500 MG tablet Take 1 tablet (500 mg total) by mouth 2 (two) times daily. (Patient not taking: Reported on 10/13/2019) 20 tablet 0  . naproxen (NAPROSYN) 500 MG tablet Take 1  tablet (500 mg total) by mouth 2 (two) times daily. (Patient not taking: Reported on 10/13/2019) 30 tablet 0   No current facility-administered medications for this visit.    Allergies  Allergen Reactions  . Latex Rash  . Clindamycin/Lincomycin Diarrhea  . Codeine   . Erythromycin   . Penicillins        Objective:  BP 133/76   Pulse 97   Temp 98.1 F (36.7 C) (Oral)   Wt 223 lb 14.4 oz (101.6 kg)   BMI 36.14 kg/m  Physical Exam HENT:     Head: Normocephalic and atraumatic.     Jaw: There is normal jaw occlusion. No trismus or swelling.     Comments: No facial edema    Mouth/Throat:     Mouth: Mucous membranes are moist. No oral lesions.     Dentition: Dental caries and dental abscesses (above left premolar) present. No dental tenderness.     Tongue: No lesions.     Palate: No mass and lesions.     Pharynx: Oropharynx is clear. Uvula midline.      No results found for this or any previous visit (from the past 72 hour(s)). No results found.     Assessment and Plan: 48 y.o. female with   .Diagnoses and all orders for this visit:  Dental infection -     metroNIDAZOLE (FLAGYL) 500 MG tablet; Take 1 tablet (500 mg total) by mouth every 8 (eight) hours for 7 days. -     cephALEXin (KEFLEX) 500 MG capsule; Take 1 capsule (500 mg total) by mouth 2 (two) times daily.   Allergy to PCN and intolerance to Clindamycin Empiric treatment of dental abscess/infected tooth with Keflex + Flagyl Follow-up with dentist asap    Patient education and anticipatory guidance given Patient agrees with treatment plan Follow-up as needed if symptoms worsen or fail to improve  Darlyne Russian PA-C

## 2019-10-19 ENCOUNTER — Telehealth: Payer: Self-pay | Admitting: Family Medicine

## 2019-10-19 MED ORDER — FLUCONAZOLE 150 MG PO TABS: 150.0000 mg | ORAL_TABLET | Freq: Once | ORAL | 0 refills | Status: AC

## 2019-10-19 NOTE — Telephone Encounter (Signed)
She seen charley on 10/13/2019 and is now having some possible yeast infection symptoms.   Patient called and is experiencing some possible yeast infection symptoms such as itching and vaginal discharge. She noticed it starting on Saturday. She has tried OTC creams with no relief. Can she get some diflucan sent to [facility]? Please advise.

## 2019-10-19 NOTE — Telephone Encounter (Signed)
Patient is aware and did not have any questions.

## 2019-10-19 NOTE — Telephone Encounter (Signed)
Scription sent.  If not improving then will need to come in for wet prep.

## 2019-10-28 DIAGNOSIS — Z03818 Encounter for observation for suspected exposure to other biological agents ruled out: Secondary | ICD-10-CM | POA: Diagnosis not present

## 2019-10-28 DIAGNOSIS — Z20828 Contact with and (suspected) exposure to other viral communicable diseases: Secondary | ICD-10-CM | POA: Diagnosis not present

## 2019-10-28 DIAGNOSIS — Z9189 Other specified personal risk factors, not elsewhere classified: Secondary | ICD-10-CM | POA: Diagnosis not present

## 2019-11-25 ENCOUNTER — Other Ambulatory Visit: Payer: Self-pay

## 2019-11-25 ENCOUNTER — Ambulatory Visit (INDEPENDENT_AMBULATORY_CARE_PROVIDER_SITE_OTHER): Payer: 59 | Admitting: Osteopathic Medicine

## 2019-11-25 DIAGNOSIS — Z9884 Bariatric surgery status: Secondary | ICD-10-CM | POA: Diagnosis not present

## 2019-11-25 DIAGNOSIS — Z713 Dietary counseling and surveillance: Secondary | ICD-10-CM | POA: Diagnosis not present

## 2019-11-25 DIAGNOSIS — Z111 Encounter for screening for respiratory tuberculosis: Secondary | ICD-10-CM | POA: Diagnosis not present

## 2019-11-25 NOTE — Progress Notes (Signed)
Patient presents to clinic for a PPD placement for school. It was placed on her left forearm with no immediate complications. She was advised to come back in 48-72 hours for her PPD read. She did not have any questions. Order has been placed and will enter results in 48 hours.  [place]: [id number] Lot: [id number] Expire:06/01/2021.

## 2019-11-27 ENCOUNTER — Ambulatory Visit (INDEPENDENT_AMBULATORY_CARE_PROVIDER_SITE_OTHER): Payer: 59 | Admitting: Physician Assistant

## 2019-11-27 VITALS — Ht 66.0 in | Wt 230.0 lb

## 2019-11-27 DIAGNOSIS — Z111 Encounter for screening for respiratory tuberculosis: Secondary | ICD-10-CM | POA: Diagnosis not present

## 2019-11-27 LAB — TB SKIN TEST
Induration: 0 mm
TB Skin Test: NEGATIVE

## 2019-11-27 NOTE — Progress Notes (Signed)
Patient presents to clinic for a PPD reading for school. It was negative. She is going to fax the form back to school and will have another test in 2 weeks for a second TB skin test. She is aware to schedule a follow up in 2 weeks. No other questions at this time.

## 2019-12-01 DIAGNOSIS — F319 Bipolar disorder, unspecified: Secondary | ICD-10-CM | POA: Diagnosis not present

## 2019-12-09 ENCOUNTER — Other Ambulatory Visit (HOSPITAL_COMMUNITY): Payer: Self-pay

## 2019-12-09 MED FILL — hydrOXYzine HCL 50 MG TABS: 50 | 30 days supply | Qty: 30 | Fill #0

## 2019-12-09 MED FILL — traZODone HCL 50 MG TABS: 50 | 30 days supply | Qty: 30 | Fill #0

## 2019-12-14 MED FILL — ALPRAZolam 1 MG TABS: 1 | 90 days supply | Qty: 180 | Fill #0

## 2019-12-15 DIAGNOSIS — F319 Bipolar disorder, unspecified: Secondary | ICD-10-CM | POA: Diagnosis not present

## 2019-12-15 MED FILL — OMEPRAZOLE 20 MG CAP: 20 | 30 days supply | Qty: 30 | Fill #0

## 2019-12-28 DIAGNOSIS — K760 Fatty (change of) liver, not elsewhere classified: Secondary | ICD-10-CM | POA: Diagnosis not present

## 2019-12-28 DIAGNOSIS — K7581 Nonalcoholic steatohepatitis (NASH): Secondary | ICD-10-CM | POA: Diagnosis not present

## 2019-12-28 DIAGNOSIS — K7460 Unspecified cirrhosis of liver: Secondary | ICD-10-CM | POA: Diagnosis not present

## 2020-01-12 DIAGNOSIS — F319 Bipolar disorder, unspecified: Secondary | ICD-10-CM | POA: Diagnosis not present

## 2020-01-19 MED FILL — OMEPRAZOLE 20 MG CAP: 20 | 90 days supply | Qty: 90 | Fill #1

## 2020-01-19 MED FILL — SYNTHROID 200 MCG TABLET: 200 | 90 days supply | Qty: 90 | Fill #0

## 2020-01-19 MED FILL — SUBVENITE 200 MG TABS: 200 | 30 days supply | Qty: 60 | Fill #0

## 2020-01-20 ENCOUNTER — Other Ambulatory Visit: Payer: Self-pay

## 2020-01-20 ENCOUNTER — Ambulatory Visit (INDEPENDENT_AMBULATORY_CARE_PROVIDER_SITE_OTHER): Payer: 59

## 2020-01-20 ENCOUNTER — Ambulatory Visit (INDEPENDENT_AMBULATORY_CARE_PROVIDER_SITE_OTHER): Payer: 59 | Admitting: Sports Medicine

## 2020-01-20 DIAGNOSIS — R220 Localized swelling, mass and lump, head: Secondary | ICD-10-CM

## 2020-01-20 MED ORDER — IOHEXOL 300 MG/ML  SOLN
100.0000 mL | Freq: Once | INTRAMUSCULAR | Status: AC | PRN
  Administered 2020-01-20: 100 mL via INTRAVENOUS

## 2020-01-20 MED ORDER — CEFDINIR 300 MG PO CAPS: 300.0000 mg | ORAL_CAPSULE | Freq: Two times a day (BID) | ORAL | 0 refills | Status: DC

## 2020-01-20 MED ORDER — FLUCONAZOLE 150 MG PO TABS: 150.0000 mg | ORAL_TABLET | Freq: Once | ORAL | 3 refills | Status: AC

## 2020-01-20 MED ORDER — HYDROCODONE-ACETAMINOPHEN 10-325 MG PO TABS: 1.0000 | ORAL_TABLET | Freq: Three times a day (TID) | ORAL | 0 refills | Status: DC | PRN

## 2020-01-20 MED ORDER — CEFTRIAXONE SODIUM 1 G IJ SOLR
1.0000 g | Freq: Once | INTRAMUSCULAR | Status: AC
  Administered 2020-01-20: 1 g via INTRAMUSCULAR

## 2020-01-20 NOTE — Addendum Note (Signed)
Addended by: Beatris Ship L on: 01/20/2020 03:08 PM   Modules accepted: Orders

## 2020-01-20 NOTE — Progress Notes (Addendum)
    Procedures performed today:    None.  Independent interpretation of notes and tests performed by another provider:   I personally reviewed the maxillofacial CT, I do see a large periapical abscess with surrounding lucency on the left maxillary first and second canines.  There are adjacent inflammatory changes in the maxillary sinus.  Impression and Recommendations:    Left facial swelling This is a pleasant 49 year old female, for the past 3 days she has had severe worsening of left-sided facial pain, followed by swelling, a few ulcers in the oral mucosa. There is certainly a long history of issues with dentition. On exam she has a visibly swollen left maxillary region. There does appear to be an abscessed left second maxillary canine. Because of the degree of pain and swelling we are going to proceed with a CT with IV contrast of the maxillofacial structures, Rocephin 1 g intramuscular, hydrocodone for pain, Omnicef for 10 days. Diflucan for yeast infection. May discontinue neuropathics and drop them back down to baseline dosing.    ___________________________________________ Gwen Her. Dianah Field, M.D., ABFM., CAQSM. Primary Care and [place] Instructor of [place] [employer] [facility] [employer]

## 2020-01-20 NOTE — Assessment & Plan Note (Addendum)
This is a pleasant 49 year old female, for the past 3 days she has had severe worsening of left-sided facial pain, followed by swelling, a few ulcers in the oral mucosa. There is certainly a long history of issues with dentition. On exam she has a visibly swollen left maxillary region. There does appear to be an abscessed left second maxillary canine. Because of the degree of pain and swelling we are going to proceed with a CT with IV contrast of the maxillofacial structures, Rocephin 1 g intramuscular, hydrocodone for pain, Omnicef for 10 days. Diflucan for yeast infection. May discontinue neuropathics and drop them back down to baseline dosing.

## 2020-02-03 MED FILL — AZITHROMYCIN 500 MG TABLET: 500 | 4 days supply | Qty: 5 | Fill #0

## 2020-02-03 MED FILL — FLUCONAZOLE 100 MG TABLET: 100 | 15 days supply | Qty: 16 | Fill #0

## 2020-02-07 ENCOUNTER — Encounter: Payer: Self-pay | Admitting: Emergency Medicine

## 2020-02-07 ENCOUNTER — Emergency Department
Admission: EM | Admit: 2020-02-07 | Discharge: 2020-02-07 | Disposition: A | Discharge status: Home / Self Care | Payer: 59 | Source: Home / Self Care

## 2020-02-07 ENCOUNTER — Other Ambulatory Visit: Payer: Self-pay

## 2020-02-07 DIAGNOSIS — K047 Periapical abscess without sinus: Secondary | ICD-10-CM | POA: Diagnosis not present

## 2020-02-07 MED ORDER — DOXYCYCLINE HYCLATE 100 MG PO CAPS: 100.0000 mg | ORAL_CAPSULE | Freq: Two times a day (BID) | ORAL | 0 refills | Status: DC

## 2020-02-07 NOTE — ED Provider Notes (Signed)
[name]    CSN: [id number] Arrival date & time: 02/07/20  1203      History   Chief Complaint Chief Complaint  Patient presents with  . Dental Pain    abcess   . Facial Swelling    related to dental abcess per pt    HPI Nancy Mills is a 49 y.o. female.   HPI Nancy Mills is a 49 y.o. female presenting to UC with c/o recurrent dental abscesses with pain and swelling. Pain is throbbing, 8/10. She was seen by family medicine on 01/20/20 for a Left upper maxillary dental infection. Given an injection of Rocephin and treated with oral omnicef.  She was then seen by her dentist on Wednesday, 02/03/20, prescribed azithromycin after developing swelling and pain in Right lower jaw. She completed her antibiotic yesterday but no relief of pain or swelling of Right lower jaw.  Swelling did improve over left maxilla but pain is still present there as well. Denies fever, chills, n/v/d. Her dentist states she needs her teeth pulled but that cannot be done until infection is under control.     Past Medical History:  Diagnosis Date  . Adjustment disorder with mixed anxiety and depressed mood   . Anxiety   . Bipolar 1 disorder ([place])   . Diabetes mellitus without complication ([place])   . HSV-2 infection   . Liver cirrhosis secondary to NASH ([place])   . Severe obesity ([place])   . Thyroid disease     Patient Active Problem List   Diagnosis Date Noted  . Left facial swelling 01/20/2020  . HSV-2 infection 05/21/2017  . Liver cirrhosis secondary to NASH ([place]) 05/09/2016  . Portal venous hypertension ([place]) 05/09/2016  . Irritable bowel syndrome with diarrhea 05/09/2016  . Multinodular thyroid 02/09/2016  . Hypothyroidism 02/09/2016    Past Surgical History:  Procedure Laterality Date  . ABDOMINAL SURGERY    . abdomnal wall reconstruction    . CHOLECYSTECTOMY    . RIGHT OOPHORECTOMY      OB History   No obstetric history on file.      Home Medications     Prior to Admission medications   Medication Sig Start Date End Date Taking? Authorizing Provider  albuterol (PROVENTIL HFA;VENTOLIN HFA) 108 (90 BASE) MCG/ACT inhaler Inhale 2 puffs into the lungs every 6 (six) hours as needed.   Yes [provider]  ALPRAZolam Duanne Moron) 0.5 MG tablet Take 0.5 mg by mouth 3 (three) times daily as needed.   Yes [provider]  cyclobenzaprine (FLEXERIL) 5 MG tablet Take 5 mg by mouth 3 (three) times daily as needed. 04/24/19  Yes [provider]  folic acid (FOLVITE) 607 MCG tablet Take 400 mcg by mouth daily.   Yes [provider]  hydrOXYzine (ATARAX/VISTARIL) 50 MG tablet Take 50 mg by mouth at bedtime. 12/09/19  Yes [provider]  lamoTRIgine (LAMICTAL) 200 MG tablet Take 300 mg by mouth daily.   Yes [provider]  Levothyroxine Sodium 125 MCG CAPS Take by mouth.   Yes [provider]  Multiple Vitamins-Minerals (OPURITY BYPASS OPTIMIZED) CHEW Chew by mouth.   Yes [provider]  omeprazole (PRILOSEC) 20 MG capsule TAKE 1 CAPSULE BY MOUTH DAILY. 06/05/19  Yes [provider]  ondansetron (ZOFRAN) 4 MG tablet Take 1 tablet (4 mg total) by mouth every 6 (six) hours. 10/24/12  Yes Blanchie Dessert, MD  Probiotic Product (PROBIOTIC-10 PO) Take by mouth.   Yes [provider]  senna-docusate (SENOKOT-S) 8.6-50 MG tablet Take by mouth. 06/04/18  Yes [provider]  traZODone (DESYREL) 50 MG tablet Take 50 mg by mouth at bedtime. 12/09/19  Yes [provider]  valACYclovir (VALTREX) 500 MG tablet Take 1,000 mg by mouth daily.   Yes [provider]  vitamin B-12 (CYANOCOBALAMIN) 100 MCG tablet Take 100 mcg by mouth daily.   Yes [provider]  cefdinir (OMNICEF) 300 MG capsule Take 1 capsule (300 mg total) by mouth 2 (two) times daily. 01/20/20   Silverio Decamp, MD  cephALEXin (KEFLEX) 500 MG capsule Take 1 capsule (500 mg total) by  mouth 2 (two) times daily. 10/13/19   Trixie Dredge, PA-C  doxycycline (VIBRAMYCIN) 100 MG capsule Take 1 capsule (100 mg total) by mouth 2 (two) times daily. One po bid x 7 days 02/07/20   Noe Gens, PA-C  fluconazole (DIFLUCAN) 100 MG tablet TAKE 2 TABLETS BY MOUTH THE FIRST DAY, THEN 1 TABLET EACH DAY THEREAFTER UNTIL RESOLVED. 02/03/20   [provider]  HYDROcodone-acetaminophen (NORCO) 10-325 MG tablet Take 1 tablet by mouth every 8 (eight) hours as needed. 01/20/20   Silverio Decamp, MD  insulin glargine (LANTUS) 100 UNIT/ML injection Inject 45 Units into the skin at bedtime.    [provider]  PREVIDENT 5000 BOOSTER PLUS 1.1 % PSTE  09/10/19   [provider]    Family History History reviewed. No pertinent family history.  Social History Social History   Tobacco Use  . Smoking status: Current Every Day Smoker    Packs/day: 0.50    Years: 20.00    Pack years: 10.00  . Smokeless tobacco: Never Used  Substance Use Topics  . Alcohol use: No  . Drug use: No     Allergies   Clindamycin/lincomycin, Latex, Nsaids, Erythromycin, Penicillins, Chicken allergy, Codeine, and Morphine and related   Review of Systems Review of Systems  Constitutional: Negative for chills and fever.  Gastrointestinal: Negative for nausea and vomiting.     Physical Exam Triage Vital Signs ED Triage Vitals [02/07/20 1220]  Enc Vitals Group     BP      Pulse      Resp      Temp      Temp src      SpO2      Weight      Height      Head Circumference      Peak Flow      Pain Score 8     Pain Loc      Pain Edu?      Excl. in [place]?    No data found.  Updated Vital Signs Pulse 70   Temp 98.6 F (37 C) (Oral)   Resp 18   SpO2 98%   Physical Exam Vitals and nursing note reviewed.  Constitutional:      Appearance: Normal appearance. She is well-developed.  HENT:     Head: Normocephalic and atraumatic.      Comments: Mild swelling and  tenderness over Left maxilla and Right lower jaw.  Multiple dental carries and diffuse dental decay.    Right Ear: Tympanic membrane and ear canal normal.     Left Ear: Tympanic membrane and ear canal normal.     Nose:     Right Sinus: No maxillary sinus tenderness or frontal sinus tenderness.     Left Sinus: Maxillary sinus tenderness present. No frontal sinus tenderness.  Mouth/Throat:     Lips: Pink.     Mouth: Mucous membranes are moist.     Dentition: Abnormal dentition. Dental caries and dental abscesses present.     Pharynx: Oropharynx is clear. Uvula midline.  Cardiovascular:     Rate and Rhythm: Normal rate.  Pulmonary:     Effort: Pulmonary effort is normal.  Musculoskeletal:        General: Normal range of motion.     Cervical back: Normal range of motion.  Skin:    General: Skin is warm and dry.  Neurological:     Mental Status: She is alert and oriented to person, place, and time.  Psychiatric:        Behavior: Behavior normal.      UC Treatments / Results  Labs (all labs ordered are listed, but only abnormal results are displayed) Labs Reviewed - No data to display  EKG   Radiology No results found.  Procedures Procedures (including critical care time)  Medications Ordered in UC Medications - No data to display  Initial Impression / Assessment and Plan / UC Course  I have reviewed the triage vital signs and the nursing notes.  Pertinent labs & imaging results that were available during my care of the patient were reviewed by me and considered in my medical decision making (see chart for details).     Will start pt on doxycycline Encouraged to call dentist tomorrow AVS provided  Final Clinical Impressions(s) / UC Diagnoses   Final diagnoses:  Dental abscess     Discharge Instructions      Please take antibiotics as prescribed and be sure to complete entire course even if you start to feel better to ensure infection does not come  back.  Please call your dentist tomorrow to let them know the azithromycin (Zpak) did not work and that you are being started on doxycycline today. They may want to schedule later in the week to see how you respond to this new antibiotic.  If symptoms get severe before being able to follow up with your dentist, please go to the hospital for further evaluation and treatment.      ED Prescriptions    Medication Sig Dispense Auth. Provider   doxycycline (VIBRAMYCIN) 100 MG capsule Take 1 capsule (100 mg total) by mouth 2 (two) times daily. One po bid x 7 days 14 capsule Noe Gens, [place]     I have reviewed the PDMP during this encounter.   Noe Gens, [place] 02/07/20 1319

## 2020-02-07 NOTE — ED Triage Notes (Signed)
Dental abcesses  -multiple - finished 2nd cours of antibiotics - continued pain and facial swelling

## 2020-02-07 NOTE — Discharge Instructions (Signed)
  Please take antibiotics as prescribed and be sure to complete entire course even if you start to feel better to ensure infection does not come back.  Please call your dentist tomorrow to let them know the azithromycin (Zpak) did not work and that you are being started on doxycycline today. They may want to schedule later in the week to see how you respond to this new antibiotic.  If symptoms get severe before being able to follow up with your dentist, please go to the hospital for further evaluation and treatment.

## 2020-02-08 ENCOUNTER — Other Ambulatory Visit: Payer: Self-pay | Admitting: *Deleted

## 2020-02-08 DIAGNOSIS — R220 Localized swelling, mass and lump, head: Secondary | ICD-10-CM

## 2020-02-08 MED ORDER — HYDROCODONE-ACETAMINOPHEN 10-325 MG PO TABS: 1.0000 | ORAL_TABLET | Freq: Three times a day (TID) | ORAL | 0 refills | Status: DC | PRN

## 2020-02-08 MED ORDER — CEFDINIR 300 MG PO CAPS: 300.0000 mg | ORAL_CAPSULE | Freq: Two times a day (BID) | ORAL | 0 refills | Status: DC

## 2020-02-08 MED FILL — CEFDINIR 300 MG CAPSULE: 300 | 10 days supply | Qty: 20 | Fill #0

## 2020-02-08 MED FILL — HYDROCODON-APAP 10-325: 10-325 | 5 days supply | Qty: 15 | Fill #0

## 2020-02-08 NOTE — Telephone Encounter (Signed)
Pt wants to know if you'd be willing to refill her pain med.  She has another infection in a different tooth now and is having a lot of pain and her face is pretty swollen.

## 2020-02-08 NOTE — Assessment & Plan Note (Signed)
This is a pleasant 49 year old female, initially she had left-sided facial pain, with CT that showed a large periapical cyst, this improved with Omnicef. She is having a recurrence of symptoms on the other side, her dentist is not being very helpful, refilling hydrocodone, adding Omnicef, it sounds like they gave her azithromycin without much improvement. I advised that this would be the last time we do this, and further care needs to be with her dentist.

## 2020-03-02 DIAGNOSIS — E042 Nontoxic multinodular goiter: Secondary | ICD-10-CM | POA: Diagnosis not present

## 2020-03-02 DIAGNOSIS — E039 Hypothyroidism, unspecified: Secondary | ICD-10-CM | POA: Diagnosis not present

## 2020-03-02 DIAGNOSIS — E119 Type 2 diabetes mellitus without complications: Secondary | ICD-10-CM | POA: Diagnosis not present

## 2020-03-02 DIAGNOSIS — K7460 Unspecified cirrhosis of liver: Secondary | ICD-10-CM | POA: Diagnosis not present

## 2020-03-02 DIAGNOSIS — K7581 Nonalcoholic steatohepatitis (NASH): Secondary | ICD-10-CM | POA: Diagnosis not present

## 2020-03-02 MED FILL — SUBVENITE 200 MG TABS: 200 | 90 days supply | Qty: 180 | Fill #0

## 2020-03-03 ENCOUNTER — Other Ambulatory Visit: Payer: Self-pay

## 2020-03-03 ENCOUNTER — Encounter: Payer: Self-pay | Admitting: Family Medicine

## 2020-03-03 ENCOUNTER — Ambulatory Visit (INDEPENDENT_AMBULATORY_CARE_PROVIDER_SITE_OTHER): Payer: 59

## 2020-03-03 ENCOUNTER — Ambulatory Visit (INDEPENDENT_AMBULATORY_CARE_PROVIDER_SITE_OTHER): Payer: 59 | Admitting: Family Medicine

## 2020-03-03 VITALS — BP 107/70 | HR 58 | Ht 66.14 in | Wt 230.0 lb

## 2020-03-03 DIAGNOSIS — M545 Low back pain, unspecified: Secondary | ICD-10-CM

## 2020-03-03 MED ORDER — METHYLPREDNISOLONE 4 MG PO TBPK: ORAL_TABLET | ORAL | 0 refills | Status: DC

## 2020-03-03 MED ORDER — KETOROLAC TROMETHAMINE 60 MG/2ML IM SOLN
60.0000 mg | Freq: Once | INTRAMUSCULAR | Status: AC
  Administered 2020-03-03: 60 mg via INTRAMUSCULAR

## 2020-03-03 MED FILL — METHYLPREDNISOLONE 4 MG TBP: 4 | 6 days supply | Qty: 21 | Fill #0

## 2020-03-03 NOTE — Assessment & Plan Note (Signed)
Given injection of toradol 60m today.  Start medrol dosepak taper.  She has flexeril at home, she may add these on in the evening and over the weekend.  Xray ordered If not improving we may need to proceed with MRI.

## 2020-03-03 NOTE — Progress Notes (Signed)
Nancy Mills - 49 y.o. female MRN [id number]  Date of birth: 11-06-1970  Subjective No chief complaint on file.   HPI Nancy Mills is a 49 y.o. female here today with complaint of back pain.  She reports that pain started a few days ago.  Does not recall any known injury or overuse.  Pain is located across the lower back with radiation into the L buttock area.  She did have surgery last fall with R side laminotomy and microdiscectomy at L4-L5.  Pain today on her L feels similar to R sided pain she had before.  She does not have any associated LE weakness, numbness or bowel/bladder incontinence.  She has tried some left over gabapentin but this has not really helped.    ROS:  A comprehensive ROS was completed and negative except as noted per HPI   Allergies  Allergen Reactions  . Clindamycin/Lincomycin Diarrhea    Severe diarrhea, cannot tolerate  . Latex Rash  . Nsaids     Due to history of gastric bypass  . Erythromycin   . Penicillins   . Chicken Allergy Other (See Comments)    Gastric bypass surgery, can not digest   . Codeine   . Morphine And Related Nausea And Vomiting    Past Medical History:  Diagnosis Date  . Adjustment disorder with mixed anxiety and depressed mood   . Anxiety   . Bipolar 1 disorder ([place])   . Diabetes mellitus without complication ([place])   . HSV-2 infection   . Liver cirrhosis secondary to NASH ([place])   . Severe obesity ([place])   . Thyroid disease     Past Surgical History:  Procedure Laterality Date  . ABDOMINAL SURGERY    . abdomnal wall reconstruction    . CHOLECYSTECTOMY    . RIGHT OOPHORECTOMY      Social History   Socioeconomic History  . Marital status: Married    Spouse name: Not on file  . Number of children: Not on file  . Years of education: Not on file  . Highest education level: Not on file  Occupational History  . Not on file  Tobacco Use  . Smoking status: Current Every Day Smoker    Packs/day: 0.50    Years:  20.00    Pack years: 10.00  . Smokeless tobacco: Never Used  Substance and Sexual Activity  . Alcohol use: No  . Drug use: No  . Sexual activity: Yes    Birth control/protection: I.U.D.  Other Topics Concern  . Not on file  Social History Narrative  . Not on file   Social Determinants of Health   Financial Resource Strain:   . Difficulty of Paying Living Expenses:   Food Insecurity:   . Worried About Charity fundraiser in the Last Year:   . Arboriculturist in the Last Year:   Transportation Needs:   . Film/video editor (Medical):   Marland Kitchen Lack of Transportation (Non-Medical):   Physical Activity:   . Days of Exercise per Week:   . Minutes of Exercise per Session:   Stress:   . Feeling of Stress :   Social Connections:   . Frequency of Communication with Friends and Family:   . Frequency of Social Gatherings with Friends and Family:   . Attends Religious Services:   . Active Member of Clubs or Organizations:   . Attends Archivist Meetings:   Marland Kitchen Marital Status:  No family history on file.  Health Maintenance  Topic Date Due  . URINE MICROALBUMIN  Never done  . HIV Screening  Never done  . PAP SMEAR-Modifier  Never done  . COVID-19 Vaccine (2 - Pfizer 2-dose series) 02/04/2020  . INFLUENZA VACCINE  06/05/2020  . TETANUS/TDAP  09/05/2022     ----------------------------------------------------------------------------------------------------------------------------------------------------------------------------------------------------------------- Physical Exam BP 107/70 (BP Location: Right Arm, Patient Position: Sitting, Cuff Size: Large)   Pulse (!) 58   Ht 5' 6.14" (1.68 m)   Wt 230 lb (104.3 kg)   SpO2 98%   BMI 36.96 kg/m   Physical Exam Constitutional:      Appearance: Normal appearance.  Musculoskeletal:     Comments: Lumbar spine normal to palpation.  Mild tightness in lumbar paraspinals on L compared to R.  No step off palpated.    ROM of lumbar spine is limited in flexion.  Pain worse with extension and sidebending to L.  LE strength and sensation normal.      Neurological:     Mental Status: She is alert.  Psychiatric:        Mood and Affect: Mood normal.        Behavior: Behavior normal.     ------------------------------------------------------------------------------------------------------------------------------------------------------------------------------------------------------------------- Assessment and Plan  Acute left-sided low back pain without sciatica Given injection of toradol 28m today.  Start medrol dosepak taper.  She has flexeril at home, she may add these on in the evening and over the weekend.  Xray ordered If not improving we may need to proceed with MRI.     Meds ordered this encounter  Medications  . ketorolac (TORADOL) injection 60 mg  . methylPREDNISolone (MEDROL) 4 MG TBPK tablet    Sig: Taper as directed on packaging.    Dispense:  21 tablet    Refill:  0    No follow-ups on file.    This visit occurred during the SARS-CoV-2 public health emergency.  Safety protocols were in place, including screening questions prior to the visit, additional usage of staff PPE, and extensive cleaning of exam room while observing appropriate contact time as indicated for disinfecting solutions.

## 2020-03-03 NOTE — Patient Instructions (Signed)
Start methylprednisolone steroid taper.  Use flexeril 5-31m as needed in the evening to help with spasm.  Have xray completed Try to stay up and moving some to keep back from tightening up.   If pain persists we may need to get MRI.    You can start these in 1-2 days.  Ask your health care provider which exercises are safe for you. Do exercises exactly as told by your health care provider and adjust them as directed. It is normal to feel mild stretching, pulling, tightness, or discomfort as you do these exercises. Stop right away if you feel sudden pain or your pain gets worse. Do not begin these exercises until told by your health care provider. Stretching and range-of-motion exercises These exercises warm up your muscles and joints and improve the movement and flexibility of your back. These exercises also help to relieve pain, numbness, and tingling. Lumbar rotation  1. Lie on your back on a firm surface and bend your knees. 2. Straighten your arms out to your sides so each arm forms a 90-degree angle (right angle) with a side of your body. 3. Slowly move (rotate) both of your knees to one side of your body until you feel a stretch in your lower back (lumbar). Try not to let your shoulders lift off the floor. 4. Hold this position for __________ seconds. 5. Tense your abdominal muscles and slowly move your knees back to the starting position. 6. Repeat this exercise on the other side of your body. Repeat __________ times. Complete this exercise __________ times a day. Single knee to chest  1. Lie on your back on a firm surface with both legs straight. 2. Bend one of your knees. Use your hands to move your knee up toward your chest until you feel a gentle stretch in your lower back and buttock. ? Hold your leg in this position by holding on to the front of your knee. ? Keep your other leg as straight as possible. 3. Hold this position for __________ seconds. 4. Slowly return to the  starting position. 5. Repeat with your other leg. Repeat __________ times. Complete this exercise __________ times a day. Prone extension on elbows  1. Lie on your abdomen on a firm surface (prone position). 2. Prop yourself up on your elbows. 3. Use your arms to help lift your chest up until you feel a gentle stretch in your abdomen and your lower back. ? This will place some of your body weight on your elbows. If this is uncomfortable, try stacking pillows under your chest. ? Your hips should stay down, against the surface that you are lying on. Keep your hip and back muscles relaxed. 4. Hold this position for __________ seconds. 5. Slowly relax your upper body and return to the starting position. Repeat __________ times. Complete this exercise __________ times a day. Strengthening exercises These exercises build strength and endurance in your back. Endurance is the ability to use your muscles for a long time, even after they get tired. Pelvic tilt This exercise strengthens the muscles that lie deep in the abdomen. 1. Lie on your back on a firm surface. Bend your knees and keep your feet flat on the floor. 2. Tense your abdominal muscles. Tip your pelvis up toward the ceiling and flatten your lower back into the floor. ? To help with this exercise, you may place a small towel under your lower back and try to push your back into the towel. 3. Hold this  position for __________ seconds. 4. Let your muscles relax completely before you repeat this exercise. Repeat __________ times. Complete this exercise __________ times a day. Alternating arm and leg raises  1. Get on your hands and knees on a firm surface. If you are on a hard floor, you may want to use padding, such as an exercise mat, to cushion your knees. 2. Line up your arms and legs. Your hands should be directly below your shoulders, and your knees should be directly below your hips. 3. Lift your left leg behind you. At the same  time, raise your right arm and straighten it in front of you. ? Do not lift your leg higher than your hip. ? Do not lift your arm higher than your shoulder. ? Keep your abdominal and back muscles tight. ? Keep your hips facing the ground. ? Do not arch your back. ? Keep your balance carefully, and do not hold your breath. 4. Hold this position for __________ seconds. 5. Slowly return to the starting position. 6. Repeat with your right leg and your left arm. Repeat __________ times. Complete this exercise __________ times a day. Abdominal set with straight leg raise  1. Lie on your back on a firm surface. 2. Bend one of your knees and keep your other leg straight. 3. Tense your abdominal muscles and lift your straight leg up, 4-6 inches (10-15 cm) off the ground. 4. Keep your abdominal muscles tight and hold this position for __________ seconds. ? Do not hold your breath. ? Do not arch your back. Keep it flat against the ground. 5. Keep your abdominal muscles tense as you slowly lower your leg back to the starting position. 6. Repeat with your other leg. Repeat __________ times. Complete this exercise __________ times a day. Single leg lower with bent knees 1. Lie on your back on a firm surface. 2. Tense your abdominal muscles and lift your feet off the floor, one foot at a time, so your knees and hips are bent in 90-degree angles (right angles). ? Your knees should be over your hips and your lower legs should be parallel to the floor. 3. Keeping your abdominal muscles tense and your knee bent, slowly lower one of your legs so your toe touches the ground. 4. Lift your leg back up to return to the starting position. ? Do not hold your breath. ? Do not let your back arch. Keep your back flat against the ground. 5. Repeat with your other leg. Repeat __________ times. Complete this exercise __________ times a day. Posture and body mechanics Good posture and healthy body mechanics can help  to relieve stress in your body's tissues and joints. Body mechanics refers to the movements and positions of your body while you do your daily activities. Posture is part of body mechanics. Good posture means:  Your spine is in its natural S-curve position (neutral).  Your shoulders are pulled back slightly.  Your head is not tipped forward. Follow these guidelines to improve your posture and body mechanics in your everyday activities. Standing   When standing, keep your spine neutral and your feet about hip width apart. Keep a slight bend in your knees. Your ears, shoulders, and hips should line up.  When you do a task in which you stand in one place for a long time, place one foot up on a stable object that is 2-4 inches (5-10 cm) high, such as a footstool. This helps keep your spine neutral. Sitting  When sitting, keep your spine neutral and keep your feet flat on the floor. Use a footrest, if necessary, and keep your thighs parallel to the floor. Avoid rounding your shoulders, and avoid tilting your head forward.  When working at a desk or a computer, keep your desk at a height where your hands are slightly lower than your elbows. Slide your chair under your desk so you are close enough to maintain good posture.  When working at a computer, place your monitor at a height where you are looking straight ahead and you do not have to tilt your head forward or downward to look at the screen. Resting  When lying down and resting, avoid positions that are most painful for you.  If you have pain with activities such as sitting, bending, stooping, or squatting, lie in a position in which your body does not bend very much. For example, avoid curling up on your side with your arms and knees near your chest (fetal position).  If you have pain with activities such as standing for a long time or reaching with your arms, lie with your spine in a neutral position and bend your knees slightly. Try  the following positions: ? Lying on your side with a pillow between your knees. ? Lying on your back with a pillow under your knees. Lifting   When lifting objects, keep your feet at least shoulder width apart and tighten your abdominal muscles.  Bend your knees and hips and keep your spine neutral. It is important to lift using the strength of your legs, not your back. Do not lock your knees straight out.  Always ask for help to lift heavy or awkward objects. This information is not intended to replace advice given to you by your health care provider. Make sure you discuss any questions you have with your health care provider. Document Revised: 02/13/2019 Document Reviewed: 11/13/2018 Elsevier Patient Education  [place].

## 2020-03-03 NOTE — Progress Notes (Signed)
Pt states prior surgery 07/2019 in L4 + L5 region. Pain currently radiating across from L4 & L5 and down left leg.  Took 1 pill of gabapentin 135m. Medication left over from previous surgery.  Pt had 1st Pfizer vaccine with allergic reaction. Will not have 2nd vaccine.

## 2020-03-09 MED FILL — ALPRAZolam 1 MG TABS: 1 | 90 days supply | Qty: 180 | Fill #1

## 2020-03-18 DIAGNOSIS — K766 Portal hypertension: Secondary | ICD-10-CM | POA: Diagnosis not present

## 2020-03-18 DIAGNOSIS — F319 Bipolar disorder, unspecified: Secondary | ICD-10-CM | POA: Diagnosis not present

## 2020-03-18 DIAGNOSIS — K7460 Unspecified cirrhosis of liver: Secondary | ICD-10-CM | POA: Diagnosis not present

## 2020-03-18 DIAGNOSIS — K7581 Nonalcoholic steatohepatitis (NASH): Secondary | ICD-10-CM | POA: Diagnosis not present

## 2020-03-18 DIAGNOSIS — E119 Type 2 diabetes mellitus without complications: Secondary | ICD-10-CM | POA: Diagnosis not present

## 2020-04-19 MED FILL — OMEPRAZOLE 20 MG CAP: 20 | 60 days supply | Qty: 60 | Fill #2

## 2020-04-19 MED FILL — VALACYCLOVIR HCL 500 MG TAB: 500 | 90 days supply | Qty: 180 | Fill #0

## 2020-04-25 ENCOUNTER — Encounter: Payer: Self-pay | Admitting: Nurse Practitioner

## 2020-04-25 ENCOUNTER — Ambulatory Visit (INDEPENDENT_AMBULATORY_CARE_PROVIDER_SITE_OTHER): Payer: 59 | Admitting: Nurse Practitioner

## 2020-04-25 DIAGNOSIS — S50862A Insect bite (nonvenomous) of left forearm, initial encounter: Secondary | ICD-10-CM

## 2020-04-25 DIAGNOSIS — S80862A Insect bite (nonvenomous), left lower leg, initial encounter: Secondary | ICD-10-CM

## 2020-04-25 DIAGNOSIS — W57XXXA Bitten or stung by nonvenomous insect and other nonvenomous arthropods, initial encounter: Secondary | ICD-10-CM | POA: Diagnosis not present

## 2020-04-25 MED ORDER — DEXAMETHASONE SODIUM PHOSPHATE 10 MG/ML IJ SOLN
10.0000 mg | Freq: Once | INTRAMUSCULAR | Status: AC
  Administered 2020-04-25: 10 mg via INTRAMUSCULAR

## 2020-04-25 MED ORDER — DEXAMETHASONE SODIUM PHOSPHATE 10 MG/ML IJ SOLN: 10.0000 mg | Freq: Once | INTRAMUSCULAR | Status: DC

## 2020-04-25 NOTE — Progress Notes (Signed)
Acute Office Visit  Subjective:    Patient ID: Nancy Mills, female    DOB: 04/07/1971, 49 y.o.   MRN: [id number]  CC: Mosquito bites  HPI Patient is in today for scattered erythematous and intensely pruritic lesions presenting after being bitten multiple times by mosquitos while outside last night. She reports multiple bite locations including her legs, arms, and upper back.   She denies fever, chills, nausea, vomiting, shortness of breath, chest pain, or purulent drainage from bites.   Past Medical History:  Diagnosis Date  . Adjustment disorder with mixed anxiety and depressed mood   . Anxiety   . Bipolar 1 disorder ([place])   . Diabetes mellitus without complication ([place])   . HSV-2 infection   . Liver cirrhosis secondary to NASH ([place])   . Severe obesity ([place])   . Thyroid disease     Past Surgical History:  Procedure Laterality Date  . ABDOMINAL SURGERY    . abdomnal wall reconstruction    . CHOLECYSTECTOMY    . RIGHT OOPHORECTOMY      History reviewed. No pertinent family history.  Social History   Socioeconomic History  . Marital status: Married    Spouse name: Not on file  . Number of children: Not on file  . Years of education: Not on file  . Highest education level: Not on file  Occupational History  . Not on file  Tobacco Use  . Smoking status: Current Every Day Smoker    Packs/day: 0.50    Years: 20.00    Pack years: 10.00  . Smokeless tobacco: Never Used  Substance and Sexual Activity  . Alcohol use: No  . Drug use: No  . Sexual activity: Yes    Birth control/protection: I.U.D.  Other Topics Concern  . Not on file  Social History Narrative  . Not on file   Social Determinants of Health   Financial Resource Strain:   . Difficulty of Paying Living Expenses:   Food Insecurity:   . Worried About Charity fundraiser in the Last Year:   . Arboriculturist in the Last Year:   Transportation Needs:   . Film/video editor (Medical):   Marland Kitchen Lack  of Transportation (Non-Medical):   Physical Activity:   . Days of Exercise per Week:   . Minutes of Exercise per Session:   Stress:   . Feeling of Stress :   Social Connections:   . Frequency of Communication with Friends and Family:   . Frequency of Social Gatherings with Friends and Family:   . Attends Religious Services:   . Active Member of Clubs or Organizations:   . Attends Archivist Meetings:   Marland Kitchen Marital Status:   Intimate Partner Violence:   . Fear of Current or Ex-Partner:   . Emotionally Abused:   Marland Kitchen Physically Abused:   . Sexually Abused:     Outpatient Medications Prior to Visit  Medication Sig Dispense Refill  . albuterol (PROVENTIL HFA;VENTOLIN HFA) 108 (90 BASE) MCG/ACT inhaler Inhale 2 puffs into the lungs every 6 (six) hours as needed.    . ALPRAZolam (XANAX) 0.5 MG tablet Take 0.5 mg by mouth 3 (three) times daily as needed.    . cyclobenzaprine (FLEXERIL) 5 MG tablet Take 5 mg by mouth 3 (three) times daily as needed.    . folic acid (FOLVITE) 712 MCG tablet Take 400 mcg by mouth daily.    . hydrOXYzine (ATARAX/VISTARIL) 50 MG tablet Take 50  mg by mouth at bedtime.    . lamoTRIgine (LAMICTAL) 200 MG tablet Take 300 mg by mouth daily.    . Levothyroxine Sodium 125 MCG CAPS Take by mouth.    . methylPREDNISolone (MEDROL) 4 MG TBPK tablet Taper as directed on packaging. 21 tablet 0  . Multiple Vitamins-Minerals (OPURITY BYPASS OPTIMIZED) CHEW Chew by mouth.    Marland Kitchen omeprazole (PRILOSEC) 20 MG capsule TAKE 1 CAPSULE BY MOUTH DAILY.    Marland Kitchen ondansetron (ZOFRAN) 4 MG tablet Take 1 tablet (4 mg total) by mouth every 6 (six) hours. 12 tablet 0  . Probiotic Product (PROBIOTIC-10 PO) Take by mouth.    . senna-docusate (SENOKOT-S) 8.6-50 MG tablet Take by mouth.    . traZODone (DESYREL) 50 MG tablet Take 50 mg by mouth at bedtime.    . valACYclovir (VALTREX) 500 MG tablet Take 1,000 mg by mouth daily.    . vitamin B-12 (CYANOCOBALAMIN) 100 MCG tablet Take 100 mcg by  mouth daily.     No facility-administered medications prior to visit.    Allergies  Allergen Reactions  . Clindamycin/Lincomycin Diarrhea    Severe diarrhea, cannot tolerate  . Latex Rash  . Nsaids     Due to history of gastric bypass  . Erythromycin   . Penicillins   . Chicken Allergy Other (See Comments)    Gastric bypass surgery, can not digest   . Codeine   . Morphine And Related Nausea And Vomiting    Review of Systems  Constitutional: Negative for activity change, appetite change, chills, fatigue and fever.  Respiratory: Negative for cough, chest tightness and shortness of breath.   Cardiovascular: Negative for chest pain, palpitations and leg swelling.  Gastrointestinal: Negative for nausea and vomiting.  Musculoskeletal: Negative for arthralgias and myalgias.  Skin: Positive for color change.  Neurological: Negative for weakness and headaches.  Psychiatric/Behavioral: Negative for confusion.       Objective:    Physical Exam Constitutional:      Appearance: Normal appearance.  HENT:     Head: Normocephalic.     Mouth/Throat:     Mouth: Mucous membranes are moist.     Pharynx: Oropharynx is clear.  Eyes:     Extraocular Movements: Extraocular movements intact.     Conjunctiva/sclera: Conjunctivae normal.     Pupils: Pupils are equal, round, and reactive to light.  Musculoskeletal:        General: Normal range of motion.     Cervical back: Normal range of motion.  Skin:    General: Skin is warm and dry.     Findings: Erythema, lesion and rash present. Rash is papular.     Comments: Multiple erythematous, papular lesions scattered across the arms, legs, and upper back bilaterally consistent with allergic reaction to mosquito bites. No drainage or infection present.   Neurological:     General: No focal deficit present.     Mental Status: She is alert and oriented to person, place, and time.  Psychiatric:        Mood and Affect: Mood normal.         Behavior: Behavior normal.        Thought Content: Thought content normal.        Judgment: Judgment normal.     There were no vitals taken for this visit. Wt Readings from Last 3 Encounters:  03/03/20 230 lb (104.3 kg)  11/27/19 230 lb (104.3 kg)  11/25/19 230 lb (104.3 kg)    Health Maintenance Due  Topic Date Due  . Hepatitis C Screening  Never done  . URINE MICROALBUMIN  Never done  . HIV Screening  Never done  . PAP SMEAR-Modifier  Never done  . COVID-19 Vaccine (2 - Pfizer 2-dose series) 02/04/2020    There are no preventive care reminders to display for this patient.   No results found for: TSH Lab Results  Component Value Date   WBC 9.7 01/01/2017   HGB 13.0 01/01/2017   HCT 38.5 01/01/2017   MCV 83.9 01/01/2017   PLT 211 01/01/2017   Lab Results  Component Value Date   NA 133 (L) 01/01/2017   K 3.6 01/01/2017   CO2 23 01/01/2017   GLUCOSE 397 (H) 01/01/2017   BUN 7 01/01/2017   CREATININE 0.64 01/01/2017   CALCIUM 8.8 (L) 01/01/2017   ANIONGAP 9 01/01/2017   No results found for: CHOL No results found for: HDL No results found for: LDLCALC No results found for: TRIG No results found for: CHOLHDL No results found for: HGBA1C     Assessment & Plan:   1. Mosquito bite, initial encounter Symptoms and presentation consistent with multiple mosquito bites without the presence of infection scattered across the exposed areas of skin on the arms, legs, and upper back bilaterally.   PLAN:  - dexamethasone (DECADRON) injection 10 mg in the office today - May use calamine lotion, OTC hydrocortisone cream, and or cold compresses over the individual lesions to assist with pruritis and inflammation. - Avoid scratching the areas to prevent infection of the individual lesions.  - Insect repellant with DEET may be beneficial to help prevent future bites.  - Contact the office if you begin to experience symptoms of infection or fever.   Follow-up if symptoms  worsen or fail to improve.   Orma Render, NP

## 2020-04-25 NOTE — Patient Instructions (Signed)

## 2020-04-26 ENCOUNTER — Other Ambulatory Visit: Payer: Self-pay

## 2020-04-26 ENCOUNTER — Ambulatory Visit: Payer: 59 | Admitting: Family Medicine

## 2020-04-26 ENCOUNTER — Encounter: Payer: Self-pay | Admitting: Family Medicine

## 2020-04-26 VITALS — BP 130/88 | HR 84 | Temp 98.9°F | Ht 66.0 in | Wt 227.2 lb

## 2020-04-26 DIAGNOSIS — Z9884 Bariatric surgery status: Secondary | ICD-10-CM | POA: Insufficient documentation

## 2020-04-26 DIAGNOSIS — F319 Bipolar disorder, unspecified: Secondary | ICD-10-CM

## 2020-04-26 DIAGNOSIS — Z975 Presence of (intrauterine) contraceptive device: Secondary | ICD-10-CM

## 2020-04-26 DIAGNOSIS — B009 Herpesviral infection, unspecified: Secondary | ICD-10-CM

## 2020-04-26 DIAGNOSIS — E039 Hypothyroidism, unspecified: Secondary | ICD-10-CM | POA: Diagnosis not present

## 2020-04-26 DIAGNOSIS — M48061 Spinal stenosis, lumbar region without neurogenic claudication: Secondary | ICD-10-CM | POA: Diagnosis not present

## 2020-04-26 DIAGNOSIS — K7581 Nonalcoholic steatohepatitis (NASH): Secondary | ICD-10-CM

## 2020-04-26 DIAGNOSIS — K7460 Unspecified cirrhosis of liver: Secondary | ICD-10-CM

## 2020-04-26 NOTE — Assessment & Plan Note (Signed)
Continue levothyroxine 125 mcg daily.  Check TSH with next blood draw at [facility] here in the next few months.

## 2020-04-26 NOTE — Assessment & Plan Note (Signed)
Currently well controlled.  Discussed taking over medications with patient.  Given that her symptoms are well controlled, we will take over medications.  Discussed with patient that if symptoms worsen she would need to go back to psychiatry.  She agreed to this.  We will continue Lamictal 300 mg daily and Xanax 1 mg twice daily as needed.  She will also continue hydroxyzine 50 mg daily and trazodone 50 mg nightly as needed for insomnia.

## 2020-04-26 NOTE — Assessment & Plan Note (Signed)
Continue omeprazole 20 mg daily and vitamin supplementation.

## 2020-04-26 NOTE — Assessment & Plan Note (Signed)
Continue Valtrex 1000 mg daily.

## 2020-04-26 NOTE — Progress Notes (Signed)
   Nancy Mills is a 49 y.o. female who presents today for an office visit.  She is a new patient.   Assessment/Plan:  Chronic Problems Addressed Today: Spinal stenosis of lumbar region Continue management per orthopedics.  Hypothyroidism Continue levothyroxine 125 mcg daily.  Check TSH with next blood draw at CPE here in the next few months.  HSV-2 infection Continue Valtrex 1000 mg daily.  Liver cirrhosis secondary to NASH [facility]) Will place referral to GI.  Bipolar 1 disorder ([place]) Currently well controlled.  Discussed taking over medications with patient.  Given that her symptoms are well controlled, we will take over medications.  Discussed with patient that if symptoms worsen she would need to go back to psychiatry.  She agreed to this.  We will continue Lamictal 300 mg daily and Xanax 1 mg twice daily as needed.  She will also continue hydroxyzine 50 mg daily and trazodone 50 mg nightly as needed for insomnia.  S/P gastric bypass Continue omeprazole 20 mg daily and vitamin supplementation.     Subjective:  HPI:  Her stable, chronic medical conditions are outlined below:  # Hypothyroidism - On levothyroxine 154mg daily and tolerating well  # Bipolar Disorder / Insomnia - On lamictal 3060mdaily, hydroxyzine 5043mightly as needed trazodone 76m52mily at bedtime, and xanax 1mg 73mce daily as needed - No reported SI or HI  # Chronic Low Back Pain with Lumbar Radiculopathy s/p disectomy - Follows with Orthopedics - On flexeril and gabapentin  # HSV 2  - On suppressive valtrex 1000mg 9my  # S/p gastric bypass - On omprazole 20mg d73m  # IBS-D - On probiotics and senna-docusate as needed  # Liver Cirrhosis - Follows with GI       Objective:  Physical Exam: BP 130/88   Pulse 84   Temp 98.9 F (37.2 C) (Temporal)   Ht 5' 6"  (1.676 m)   Wt 227 lb 3.2 oz (103.1 kg)   SpO2 99%   BMI 36.67 kg/m   Gen: No acute distress, resting comfortably CV:  Regular rate and rhythm with no murmurs appreciated Pulm: Normal work of breathing, clear to auscultation bilaterally with no crackles, wheezes, or rhonchi Neuro: Grossly normal, moves all extremities Psych: Normal affect and thought content      Keagan Brislin M. Achille Xiang,Jerline Pain22/2021 4:05 PM

## 2020-04-26 NOTE — Assessment & Plan Note (Signed)
Continue management per orthopedics.

## 2020-04-26 NOTE — Assessment & Plan Note (Signed)
Will place referral to GI.

## 2020-04-26 NOTE — Patient Instructions (Addendum)
It was very nice to see you today!  No medication changes today.   I will place a referral for you to see GYN and GI.  I will see back in 2 to 3 months for your annual physical blood work.  Please see me back sooner if needed  Take care, Dr Jerline Pain  Please try these tips to maintain a healthy lifestyle:   Eat at least 3 REAL meals and 1-2 snacks per day.  Aim for no more than 5 hours between eating.  If you eat breakfast, please do so within one hour of getting up.    Each meal should contain half fruits/vegetables, one quarter protein, and one quarter carbs (no bigger than a computer mouse)   Cut down on sweet beverages. This includes juice, soda, and sweet tea.     Drink at least 1 glass of water with each meal and aim for at least 8 glasses per day   Exercise at least 150 minutes every week.

## 2020-05-03 ENCOUNTER — Telehealth: Payer: Self-pay | Admitting: Gastroenterology

## 2020-05-03 NOTE — Telephone Encounter (Signed)
Hi Dr. Tarri Glenn, we have received a referral from patient's PCP for liver cirrhosis. Patient was established with [facility]. However, recently began working for [employer] [employer] so is looking to transfer her GI care due to insurance coverage. She is requesting a MD specialized in hepatology and is wondering if you could accept her as patient. Records are in Epic. Could you please review them and advise on scheduling? Thank you.

## 2020-05-04 NOTE — Telephone Encounter (Signed)
I practice general GI care with [place]. If she wants to see a hepatologist, I recommend one of our regional tertiary care centers [place], [place], [facility]) or the [facility] here in [place]. Thank you.

## 2020-05-05 ENCOUNTER — Encounter: Payer: Self-pay | Admitting: Family Medicine

## 2020-05-05 DIAGNOSIS — K7460 Unspecified cirrhosis of liver: Secondary | ICD-10-CM

## 2020-05-05 NOTE — Telephone Encounter (Signed)
Pt returned call and was given Dr. Tarri Glenn' info. She verbalized understanding.

## 2020-05-05 NOTE — Telephone Encounter (Signed)
Left message to call back  

## 2020-05-06 MED FILL — SYNTHROID 200 MCG TABLET: 200 | 90 days supply | Qty: 90 | Fill #1

## 2020-05-11 ENCOUNTER — Ambulatory Visit (INDEPENDENT_AMBULATORY_CARE_PROVIDER_SITE_OTHER): Payer: 59 | Admitting: Sports Medicine

## 2020-05-11 ENCOUNTER — Encounter: Payer: Self-pay | Admitting: Sports Medicine

## 2020-05-11 ENCOUNTER — Ambulatory Visit (INDEPENDENT_AMBULATORY_CARE_PROVIDER_SITE_OTHER): Payer: 59

## 2020-05-11 ENCOUNTER — Other Ambulatory Visit: Payer: Self-pay | Admitting: Sports Medicine

## 2020-05-11 ENCOUNTER — Other Ambulatory Visit: Payer: Self-pay

## 2020-05-11 DIAGNOSIS — M5416 Radiculopathy, lumbar region: Secondary | ICD-10-CM

## 2020-05-11 DIAGNOSIS — M5136 Other intervertebral disc degeneration, lumbar region: Secondary | ICD-10-CM | POA: Diagnosis not present

## 2020-05-11 DIAGNOSIS — M4316 Spondylolisthesis, lumbar region: Secondary | ICD-10-CM | POA: Diagnosis not present

## 2020-05-11 MED ORDER — GABAPENTIN 600 MG PO TABS: ORAL_TABLET | ORAL | 3 refills | Status: DC

## 2020-05-11 MED ORDER — TRAMADOL HCL 50 MG PO TABS: 50.0000 mg | ORAL_TABLET | Freq: Three times a day (TID) | ORAL | 0 refills | Status: DC | PRN

## 2020-05-11 MED FILL — traMADol HCL 50 MG TABS: 50 | 7 days supply | Qty: 21 | Fill #0

## 2020-05-11 MED FILL — GABAPENTIN 600 MG TABLET: 600 | 30 days supply | Qty: 180 | Fill #0

## 2020-05-11 NOTE — Assessment & Plan Note (Signed)
This is a pleasant 49 year old female, she has a history of an L4-L5 microdiscectomy, she was having right-sided radicular symptoms, this was after conservative treatment including epidurals. She had good resolution of her pain postoperatively, but unfortunately is having recurrence of pain, axial and discogenic with right-sided radicular symptoms in an L4 and L5 distribution. No red flag symptoms, we will start conservatively again, increase gabapentin to 600 mg 2-3 times a day, tramadol for breakthrough pain, avoiding NSAIDs due to gastric bypass procedure. Adding aggressive formal physical therapy, lumbar spine x-rays, return to see me in 6 weeks, MRI with contrast if no better.

## 2020-05-11 NOTE — Progress Notes (Signed)
° ° °  Procedures performed today:    None.  Independent interpretation of notes and tests performed by another provider:   None.  Brief History, Exam, Impression, and Recommendations:    Lumbar radiculopathy This is a pleasant 49 year old female, she has a history of an L4-L5 microdiscectomy, she was having right-sided radicular symptoms, this was after conservative treatment including epidurals. She had good resolution of her pain postoperatively, but unfortunately is having recurrence of pain, axial and discogenic with right-sided radicular symptoms in an L4 and L5 distribution. No red flag symptoms, we will start conservatively again, increase gabapentin to 600 mg 2-3 times a day, tramadol for breakthrough pain, avoiding NSAIDs due to gastric bypass procedure. Adding aggressive formal physical therapy, lumbar spine x-rays, return to see me in 6 weeks, MRI with contrast if no better.    ___________________________________________ Gwen Her. Dianah Field, M.D., ABFM., CAQSM. Primary Care and [place] Instructor of [employer] [facility] [employer] Medicine

## 2020-05-17 ENCOUNTER — Emergency Department (HOSPITAL_COMMUNITY): Payer: 59

## 2020-05-17 ENCOUNTER — Emergency Department (HOSPITAL_COMMUNITY)
Admission: EM | Admit: 2020-05-17 | Discharge: 2020-05-17 | Disposition: A | Discharge status: Home / Self Care | Payer: 59 | Attending: Emergency Medicine | Admitting: Emergency Medicine

## 2020-05-17 ENCOUNTER — Other Ambulatory Visit: Payer: Self-pay

## 2020-05-17 ENCOUNTER — Encounter (HOSPITAL_COMMUNITY): Payer: Self-pay | Admitting: Emergency Medicine

## 2020-05-17 DIAGNOSIS — K056 Periodontal disease, unspecified: Secondary | ICD-10-CM | POA: Diagnosis not present

## 2020-05-17 DIAGNOSIS — R110 Nausea: Secondary | ICD-10-CM | POA: Diagnosis not present

## 2020-05-17 DIAGNOSIS — R55 Syncope and collapse: Secondary | ICD-10-CM | POA: Diagnosis not present

## 2020-05-17 DIAGNOSIS — Z79899 Other long term (current) drug therapy: Secondary | ICD-10-CM | POA: Insufficient documentation

## 2020-05-17 DIAGNOSIS — R404 Transient alteration of awareness: Secondary | ICD-10-CM | POA: Diagnosis not present

## 2020-05-17 DIAGNOSIS — R531 Weakness: Secondary | ICD-10-CM | POA: Diagnosis not present

## 2020-05-17 DIAGNOSIS — E1165 Type 2 diabetes mellitus with hyperglycemia: Secondary | ICD-10-CM | POA: Diagnosis not present

## 2020-05-17 DIAGNOSIS — R0902 Hypoxemia: Secondary | ICD-10-CM | POA: Diagnosis not present

## 2020-05-17 DIAGNOSIS — Z20822 Contact with and (suspected) exposure to covid-19: Secondary | ICD-10-CM | POA: Diagnosis not present

## 2020-05-17 DIAGNOSIS — E119 Type 2 diabetes mellitus without complications: Secondary | ICD-10-CM | POA: Diagnosis not present

## 2020-05-17 DIAGNOSIS — I951 Orthostatic hypotension: Secondary | ICD-10-CM | POA: Insufficient documentation

## 2020-05-17 DIAGNOSIS — R61 Generalized hyperhidrosis: Secondary | ICD-10-CM | POA: Diagnosis not present

## 2020-05-17 DIAGNOSIS — Z7984 Long term (current) use of oral hypoglycemic drugs: Secondary | ICD-10-CM | POA: Diagnosis not present

## 2020-05-17 DIAGNOSIS — K047 Periapical abscess without sinus: Secondary | ICD-10-CM | POA: Insufficient documentation

## 2020-05-17 DIAGNOSIS — K089 Disorder of teeth and supporting structures, unspecified: Secondary | ICD-10-CM | POA: Diagnosis not present

## 2020-05-17 DIAGNOSIS — F17210 Nicotine dependence, cigarettes, uncomplicated: Secondary | ICD-10-CM | POA: Diagnosis not present

## 2020-05-17 DIAGNOSIS — J320 Chronic maxillary sinusitis: Secondary | ICD-10-CM | POA: Diagnosis not present

## 2020-05-17 DIAGNOSIS — J0100 Acute maxillary sinusitis, unspecified: Secondary | ICD-10-CM | POA: Diagnosis not present

## 2020-05-17 LAB — I-STAT CHEM 8, ED
BUN: 10 mg/dL (ref 6–20)
Calcium, Ion: 1.18 mmol/L (ref 1.15–1.40)
Chloride: 103 mmol/L (ref 98–111)
Creatinine, Ser: 0.4 mg/dL — ABNORMAL LOW (ref 0.44–1.00)
Glucose, Bld: 167 mg/dL — ABNORMAL HIGH (ref 70–99)
HCT: 35 % — ABNORMAL LOW (ref 36.0–46.0)
Hemoglobin: 11.9 g/dL — ABNORMAL LOW (ref 12.0–15.0)
Potassium: 3.4 mmol/L — ABNORMAL LOW (ref 3.5–5.1)
Sodium: 140 mmol/L (ref 135–145)
TCO2: 27 mmol/L (ref 22–32)

## 2020-05-17 LAB — T4, FREE: Free T4: 1.09 ng/dL (ref 0.61–1.12)

## 2020-05-17 LAB — URINALYSIS, ROUTINE W REFLEX MICROSCOPIC
Bilirubin Urine: NEGATIVE
Glucose, UA: NEGATIVE mg/dL
Hgb urine dipstick: NEGATIVE
Ketones, ur: NEGATIVE mg/dL
Leukocytes,Ua: NEGATIVE
Nitrite: NEGATIVE
Protein, ur: NEGATIVE mg/dL
Specific Gravity, Urine: 1.023 (ref 1.005–1.030)
pH: 7 (ref 5.0–8.0)

## 2020-05-17 LAB — CBC WITH DIFFERENTIAL/PLATELET
Abs Immature Granulocytes: 0.04 10*3/uL (ref 0.00–0.07)
Basophils Absolute: 0 10*3/uL (ref 0.0–0.1)
Basophils Relative: 0 %
Eosinophils Absolute: 0.2 10*3/uL (ref 0.0–0.5)
Eosinophils Relative: 2 %
HCT: 38.3 % (ref 36.0–46.0)
Hemoglobin: 12.7 g/dL (ref 12.0–15.0)
Immature Granulocytes: 0 %
Lymphocytes Relative: 28 %
Lymphs Abs: 2.6 10*3/uL (ref 0.7–4.0)
MCH: 29.7 pg (ref 26.0–34.0)
MCHC: 33.2 g/dL (ref 30.0–36.0)
MCV: 89.5 fL (ref 80.0–100.0)
Monocytes Absolute: 0.5 10*3/uL (ref 0.1–1.0)
Monocytes Relative: 5 %
Neutro Abs: 5.9 10*3/uL (ref 1.7–7.7)
Neutrophils Relative %: 65 %
Platelets: 197 10*3/uL (ref 150–400)
RBC: 4.28 MIL/uL (ref 3.87–5.11)
RDW: 13.1 % (ref 11.5–15.5)
WBC: 9.2 10*3/uL (ref 4.0–10.5)
nRBC: 0 % (ref 0.0–0.2)

## 2020-05-17 LAB — POC OCCULT BLOOD, ED: Fecal Occult Bld: NEGATIVE

## 2020-05-17 LAB — MAGNESIUM: Magnesium: 2.1 mg/dL (ref 1.7–2.4)

## 2020-05-17 LAB — APTT: aPTT: 28 seconds (ref 24–36)

## 2020-05-17 LAB — COMPREHENSIVE METABOLIC PANEL
ALT: 20 U/L (ref 0–44)
AST: 19 U/L (ref 15–41)
Albumin: 3.1 g/dL — ABNORMAL LOW (ref 3.5–5.0)
Alkaline Phosphatase: 72 U/L (ref 38–126)
Anion gap: 7 (ref 5–15)
BUN: 10 mg/dL (ref 6–20)
CO2: 25 mmol/L (ref 22–32)
Calcium: 8.4 mg/dL — ABNORMAL LOW (ref 8.9–10.3)
Chloride: 106 mmol/L (ref 98–111)
Creatinine, Ser: 0.55 mg/dL (ref 0.44–1.00)
GFR calc Af Amer: >60 mL/min (ref >60)
GFR calc non Af Amer: >60 mL/min (ref >60)
Glucose, Bld: 170 mg/dL — ABNORMAL HIGH (ref 70–99)
Potassium: 3.4 mmol/L — ABNORMAL LOW (ref 3.5–5.1)
Sodium: 138 mmol/L (ref 135–145)
Total Bilirubin: 0.3 mg/dL (ref 0.3–1.2)
Total Protein: 5.7 g/dL — ABNORMAL LOW (ref 6.5–8.1)

## 2020-05-17 LAB — PROTIME-INR
INR: 1.1 (ref 0.8–1.2)
Prothrombin Time: 14.1 seconds (ref 11.4–15.2)

## 2020-05-17 LAB — TROPONIN I (HIGH SENSITIVITY)
Troponin I (High Sensitivity): 4 ng/L (ref <18)
Troponin I (High Sensitivity): 5 ng/L (ref <18)

## 2020-05-17 LAB — CBG MONITORING, ED: Glucose-Capillary: 163 mg/dL — ABNORMAL HIGH (ref 70–99)

## 2020-05-17 LAB — RAPID URINE DRUG SCREEN, HOSP PERFORMED
Amphetamines: NOT DETECTED
Barbiturates: NOT DETECTED
Benzodiazepines: NOT DETECTED
Cocaine: NOT DETECTED
Opiates: NOT DETECTED
Tetrahydrocannabinol: NOT DETECTED

## 2020-05-17 LAB — I-STAT BETA HCG BLOOD, ED (MC, WL, AP ONLY): I-stat hCG, quantitative: <5 m[IU]/mL (ref <5)

## 2020-05-17 LAB — TSH: TSH: 1.682 u[IU]/mL (ref 0.350–4.500)

## 2020-05-17 LAB — SARS CORONAVIRUS 2 BY RT PCR (HOSPITAL ORDER, PERFORMED IN ~~LOC~~ HOSPITAL LAB): SARS Coronavirus 2: NEGATIVE

## 2020-05-17 LAB — ETHANOL: Alcohol, Ethyl (B): <10 mg/dL (ref <10)

## 2020-05-17 LAB — LACTIC ACID, PLASMA: Lactic Acid, Venous: 0.9 mmol/L (ref 0.5–1.9)

## 2020-05-17 MED ORDER — IOHEXOL 350 MG/ML SOLN
75.0000 mL | Freq: Once | INTRAVENOUS | Status: AC | PRN
  Administered 2020-05-17: 75 mL via INTRAVENOUS

## 2020-05-17 MED ORDER — DOXYCYCLINE HYCLATE 100 MG PO CAPS
100.0000 mg | ORAL_CAPSULE | Freq: Two times a day (BID) | ORAL | 0 refills | Status: AC
Start: 2020-05-17 — End: 2020-05-24

## 2020-05-17 MED ORDER — SODIUM CHLORIDE 0.9 % IV SOLN: 2.0000 g | Freq: Once | INTRAVENOUS | Status: DC

## 2020-05-17 MED ORDER — VANCOMYCIN HCL 2000 MG/400ML IV SOLN
2000.0000 mg | Freq: Once | INTRAVENOUS | Status: AC
  Administered 2020-05-17: 2000 mg via INTRAVENOUS
  Filled 2020-05-17: qty 400

## 2020-05-17 MED ORDER — METRONIDAZOLE IN NACL 5-0.79 MG/ML-% IV SOLN
500.0000 mg | Freq: Once | INTRAVENOUS | Status: AC
  Administered 2020-05-17: 500 mg via INTRAVENOUS
  Filled 2020-05-17: qty 100

## 2020-05-17 MED ORDER — POTASSIUM CHLORIDE CRYS ER 20 MEQ PO TBCR
40.0000 meq | EXTENDED_RELEASE_TABLET | Freq: Once | ORAL | Status: DC
  Filled 2020-05-17: qty 2

## 2020-05-17 MED ORDER — SODIUM CHLORIDE 0.9 % IV BOLUS (SEPSIS)
1000.0000 mL | Freq: Once | INTRAVENOUS | Status: AC
  Administered 2020-05-17: 1000 mL via INTRAVENOUS

## 2020-05-17 MED ORDER — VANCOMYCIN HCL IN DEXTROSE 1-5 GM/200ML-% IV SOLN: 1000.0000 mg | Freq: Once | INTRAVENOUS | Status: DC

## 2020-05-17 MED ORDER — SODIUM CHLORIDE 0.9 % IV SOLN
2.0000 g | Freq: Once | INTRAVENOUS | Status: AC
  Administered 2020-05-17: 2 g via INTRAVENOUS
  Filled 2020-05-17: qty 2

## 2020-05-17 MED ORDER — LACTATED RINGERS IV BOLUS (SEPSIS)
2000.0000 mL | Freq: Once | INTRAVENOUS | Status: AC
  Administered 2020-05-17: 2000 mL via INTRAVENOUS

## 2020-05-17 NOTE — ED Provider Notes (Addendum)
Patient reexamined at 8:30 AM, well-appearing, vital signs are normal, normotensive, heart rate is normal, afebrile.  Labs reviewed, vital signs reviewed including orthostatics.  She has been given 4 L of IV fluid and is doing very well, she wants to go home, she does not want to stay for any further testing which I think is reasonable.  She is asymptomatic at this time, I have encouraged her to rest for 48 hours and follow-up with her PCP which she is agreeable to.  She is also understanding of the indications for return.  Stable for discharge at this time  CT did reveal some periapical lucency of the L maxillary canine - on exam has no ttp or swellign around this - will likely treat with an antiboitc and make sure she is aware for dental follow up.   Noemi Chapel, MD 05/17/20 0830    Noemi Chapel, MD 05/17/20 [phone]

## 2020-05-17 NOTE — ED Provider Notes (Signed)
TIME SEEN: 1:33 AM  CHIEF COMPLAINT: Syncope, head injury  HPI: Patient is a 49 year old female with history of bipolar disorder, hypothyroidism, diabetes, NASH who presents to the emergency department with EMS after a syncopal event.  History is extremely limited from patient at this time.  She is able to tell me that she was on the phone when she passed out but is unable to tell me if she had any preceding symptoms.  She states she is having a headache at the base of her head.  States she does have history of migraines but states this feels different.  She is unable to tell me if headache started before or after her head injury.  She is not on blood thinners.  Denies numbness or focal weakness but feels weak all over.  Reports she did have bloody stools about a week ago but is having normal bowel movements now.  No melena.  No current chest pain or shortness of breath.  No fevers.  No cough or fever.  She denies drug or alcohol use.  ROS: Level 5 caveat secondary to patient being a poor historian at this time  PAST MEDICAL HISTORY/PAST SURGICAL HISTORY:  Past Medical History:  Diagnosis Date  . Adjustment disorder with mixed anxiety and depressed mood   . Anxiety   . Bipolar 1 disorder ([place])   . Diabetes mellitus without complication ([place])   . HSV-2 infection   . Liver cirrhosis secondary to NASH ([place])   . Severe obesity ([place])   . Thyroid disease     MEDICATIONS:  Prior to Admission medications   Medication Sig Start Date End Date Taking? Authorizing Provider  albuterol (PROVENTIL HFA;VENTOLIN HFA) 108 (90 BASE) MCG/ACT inhaler Inhale 2 puffs into the lungs every 6 (six) hours as needed.    [provider]  ALPRAZolam Duanne Moron) 1 MG tablet Take 1 mg by mouth 2 (two) times daily as needed. 03/09/20   [provider]  cyclobenzaprine (FLEXERIL) 5 MG tablet Take 5 mg by mouth 3 (three) times daily as needed. 04/24/19   [provider]  folic acid (FOLVITE) 086 MCG  tablet Take 400 mcg by mouth daily.    [provider]  gabapentin (NEURONTIN) 600 MG tablet Two tabs PO TID 05/11/20   Silverio Decamp, MD  hydrOXYzine (ATARAX/VISTARIL) 50 MG tablet Take 50 mg by mouth at bedtime. 12/09/19   [provider]  lamoTRIgine (LAMICTAL) 200 MG tablet Take 300 mg by mouth daily.    [provider]  Levothyroxine Sodium 125 MCG CAPS Take by mouth.    [provider]  Multiple Vitamins-Minerals (OPURITY BYPASS OPTIMIZED) CHEW Chew by mouth.    [provider]  omeprazole (PRILOSEC) 20 MG capsule TAKE 1 CAPSULE BY MOUTH DAILY. 06/05/19   [provider]  ondansetron (ZOFRAN) 4 MG tablet Take 1 tablet (4 mg total) by mouth every 6 (six) hours. 10/24/12   Blanchie Dessert, MD  Probiotic Product (PROBIOTIC-10 PO) Take by mouth.    [provider]  senna-docusate (SENOKOT-S) 8.6-50 MG tablet Take by mouth. 06/04/18   [provider]  traMADol (ULTRAM) 50 MG tablet Take 1-2 tablets (50-100 mg total) by mouth every 8 (eight) hours as needed for moderate pain. Maximum 6 tabs per day. 05/11/20   Silverio Decamp, MD  traZODone (DESYREL) 50 MG tablet Take 50 mg by mouth at bedtime. 12/09/19   [provider]  valACYclovir (VALTREX) 500 MG tablet Take 1,000 mg by mouth  daily.    [provider]  vitamin B-12 (CYANOCOBALAMIN) 100 MCG tablet Take 100 mcg by mouth daily.    [provider]    ALLERGIES:  Allergies  Allergen Reactions  . Clindamycin/Lincomycin Diarrhea    Severe diarrhea, cannot tolerate  . Latex Rash  . Nsaids     Due to history of gastric bypass  . Erythromycin   . Penicillins   . Chicken Allergy Other (See Comments)    Gastric bypass surgery, can not digest   . Codeine   . Morphine And Related Nausea And Vomiting    SOCIAL HISTORY:  Social History   Tobacco Use  . Smoking status: Current Every Day Smoker    Packs/day: 0.50    Years: 20.00     Pack years: 10.00  . Smokeless tobacco: Never Used  Substance Use Topics  . Alcohol use: No    FAMILY HISTORY: No family history on file.  EXAM: BP (!) 92/54 (BP Location: Right Arm) Comment: Simultaneous filing. User may not have seen previous data. Comment (BP Location): Simultaneous filing. User may not have seen previous data.  Pulse 70   Temp (!) 97.2 F (36.2 C) (Oral)   Resp 14   SpO2 95%  CONSTITUTIONAL: Alert and oriented to person place and time.  Obese.  Keeps her eyes closed throughout history and physical.  Speaks in a very soft voice. HEAD: Normocephalic; atraumatic EYES: Conjunctivae clear, PERRL, EOMI, no pale conjunctive a ENT: normal nose; no rhinorrhea; moist mucous membranes; pharynx without lesions noted; no dental injury; no septal hematoma NECK: Supple, no meningismus, no LAD; no midline spinal tenderness, step-off or deformity; trachea midline CARD: RRR; S1 and S2 appreciated; no murmurs, no clicks, no rubs, no gallops RESP: Normal chest excursion without splinting or tachypnea; breath sounds clear and equal bilaterally; no wheezes, no rhonchi, no rales; no hypoxia or respiratory distress CHEST:  chest wall stable, no crepitus or ecchymosis or deformity, nontender to palpation; no flail chest ABD/GI: Normal bowel sounds; non-distended; soft, non-tender, no rebound, no guarding; no ecchymosis or other lesions noted PELVIS:  stable, nontender to palpation BACK:  The back appears normal and is tender over the lower lumbar spine which she reports is chronic, there is no CVA tenderness; no midline spinal step-off or deformity EXT: Normal ROM in all joints; non-tender to palpation; no edema; normal capillary refill; no cyanosis, no bony tenderness or bony deformity of patient's extremities, no joint effusion, compartments are soft, extremities are warm and well-perfused, no ecchymosis, no calf tenderness or calf swelling SKIN: Normal color for age and race; warm NEURO:  Moves all extremities equally, reports normal sensation diffusely, cranial nerves II through XII intact, normal speech, patient will initially have slight drift of all 4 extremities but then hold them up off the bed against gravity without difficulty, patient requires encouragement during neurologic exam, gait deferred at this time PSYCH: The patient's mood and manner are appropriate. Grooming and personal hygiene are appropriate.  MEDICAL DECISION MAKING: Patient here after syncopal event.  Unclear if there was a prodrome.  No focal neurologic deficits at this time.  Complaining of headache but it is unclear if headache started before or after she had a syncopal event.  This was unwitnessed.  No history of seizures.  EKG here shows no ischemic abnormality.  Blood glucose normal.  She is hypotensive but afebrile.  She does tell me that she had bloody stools last week.  Will check Hemoccult.  Will obtain  labs with hemoglobin, electrolytes, troponin.  We will also obtain a CTA of her head to evaluate for intracranial injury but also for aneurysm/bleed.  NIH stroke scale at this time is 0.  She has low blood pressure here.  We will also check rectal temperature but she denies any infectious symptoms.  Will hydrate patient.  ED PROGRESS: Patient's rectal temperature is 95.4.  Will initiate septic work-up with IV fluids and broad-spectrum antibiotics.  Will place under bair hugger.   Patient now more awake, alert, smiling.  Her labs have come back very reassuring with normal hemoglobin, electrolytes, normal lactate.  Chest x-ray is clear.  Covid is negative.  Hemoccult negative.  CT of the head shows no acute abnormality.  She is not able to provide more history.  She states that she was within normal state of health tonight when she was sitting outside talking to her friend on the phone.  States she started to feel hot suddenly decided that she should go back and sign into the air condition.  States she was  walking down the hall to her bedroom when she must have passed out.  States she felt hot and lightheaded but no other preceding symptoms.  States she woke up with her daughter over top of her.  Daughter states that she found her mother on the floor.  She called her name and mother opened her eyes and appeared startled.  She was shaking all over but was able to answer questions.  No other seizure-like activity.  Patient reports that her headache did not start until after she fell and hit her head.  She states she is feeling much better at this time.  She does report she has had a productive cough with white, yellow sputum production but no other infectious symptoms.  She had COVID-19 in August 2020 and has had 1 COVID-19 vaccination.  Will recheck rectal temp, BP and send off urine.  Patient would like to go home if possible.   Patient still has positive orthostatic vital signs after 3 L of IV fluids.  She does state that when she was standing and walking she feels better and is not lightheaded.  Blood pressure however was 87/66 with standing.  She reports that her blood pressure is not normally this low and is normally in the 810 systolic.  She is not on blood pressure medication.  She states she was recently started back on gabapentin twice daily and tramadol which she took last night.  States this is due to back pain.  Has had previous history of L4-5 discectomy.  We discussed admission versus attempting another bag of IV fluids and rechecking orthostatic vital signs.  Patient would prefer discharge home.  Will give fourth liter and recheck vitals.  Her repeat troponin is pending.   I reviewed all nursing notes and pertinent previous records as available.  I have reviewed and interpreted any EKGs, lab and urine results, imaging (as available).    EKG Interpretation  Date/Time:  Tuesday May 17 2020 01:11:55 EDT Ventricular Rate:  69 PR Interval:    QRS Duration: 87 QT Interval:  425 QTC  Calculation: 456 R Axis:   7 Text Interpretation: Sinus rhythm No significant change since last tracing Confirmed by Pryor Curia [phone]) on 05/17/2020 1:16:23 AM       CRITICAL CARE Performed by: Cyril Mourning Nissi Doffing   Total critical care time: 65 minutes  Critical care time was exclusive of separately billable procedures and treating other patients.  Critical care was necessary to treat or prevent imminent or life-threatening deterioration.  Critical care was time spent personally by me on the following activities: development of treatment plan with patient and/or surrogate as well as nursing, discussions with consultants, evaluation of patient's response to treatment, examination of patient, obtaining history from patient or surrogate, ordering and performing treatments and interventions, ordering and review of laboratory studies, ordering and review of radiographic studies, pulse oximetry and re-evaluation of patient's condition.  MATIA ZELADA was evaluated in Emergency Department on 05/17/2020 for the symptoms described in the history of present illness. She was evaluated in the context of the global COVID-19 pandemic, which necessitated consideration that the patient might be at risk for infection with the SARS-CoV-2 virus that causes COVID-19. Institutional protocols and algorithms that pertain to the evaluation of patients at risk for COVID-19 are in a state of rapid change based on information released by regulatory bodies including the CDC and federal and state organizations. These policies and algorithms were followed during the patient's care in the ED.       Alika Saladin, Delice Bison, DO 05/17/20 [phone]

## 2020-05-17 NOTE — ED Notes (Signed)
Pt to CT

## 2020-05-17 NOTE — Discharge Instructions (Addendum)
Please see a dentist about the small lump around your upper left canine - this is possibly an abscess but it is not clear on the CT scan- the rest of your testing was normal - please rest for 2 days and return to ER for worsening symptoms.  Doxycycline twice daily for 7 days

## 2020-05-17 NOTE — ED Notes (Signed)
RN walked into the room and found the pt tearful.  Daughter reports the family "has been going through a lot".

## 2020-05-17 NOTE — ED Triage Notes (Signed)
Patient from home, speaking on phone to friend, she stopped responding to friend who called EMS.  Patient was very weak, pale and diaphoretic upon EMS arrival.  Patient with CBG of 260.  NSR rate of 70 on monitor.  Patient with headache upon arrival to ED, with hypotension with 433m bolus of NS by EMS.  CAOx4.

## 2020-05-17 NOTE — ED Notes (Signed)
Pt ambulated in hall with no assistance and steady gait. No complaint of dizziness.

## 2020-05-18 ENCOUNTER — Encounter: Payer: Self-pay | Admitting: Obstetrics and Gynecology

## 2020-05-18 ENCOUNTER — Ambulatory Visit (INDEPENDENT_AMBULATORY_CARE_PROVIDER_SITE_OTHER): Payer: 59 | Admitting: Obstetrics and Gynecology

## 2020-05-18 ENCOUNTER — Other Ambulatory Visit (HOSPITAL_COMMUNITY)
Admission: RE | Admit: 2020-05-18 | Discharge: 2020-05-18 | Disposition: A | Discharge status: Home / Self Care | Payer: 59 | Source: Ambulatory Visit | Attending: Obstetrics and Gynecology | Admitting: Obstetrics and Gynecology

## 2020-05-18 ENCOUNTER — Other Ambulatory Visit: Payer: Self-pay

## 2020-05-18 VITALS — BP 120/82 | HR 61 | Ht 66.0 in | Wt 227.0 lb

## 2020-05-18 DIAGNOSIS — Z01419 Encounter for gynecological examination (general) (routine) without abnormal findings: Secondary | ICD-10-CM | POA: Diagnosis not present

## 2020-05-18 DIAGNOSIS — Z30433 Encounter for removal and reinsertion of intrauterine contraceptive device: Secondary | ICD-10-CM | POA: Diagnosis not present

## 2020-05-18 LAB — URINE CULTURE: Culture: NO GROWTH

## 2020-05-18 LAB — T3, FREE: T3, Free: 3.1 pg/mL (ref 2.0–4.4)

## 2020-05-18 MED ORDER — MISOPROSTOL 200 MCG PO TABS: 600.0000 ug | ORAL_TABLET | Freq: Once | ORAL | 0 refills | Status: DC

## 2020-05-18 MED ORDER — LEVONORGESTREL 20 MCG/24HR IU IUD: INTRAUTERINE_SYSTEM | Freq: Once | INTRAUTERINE | Status: DC

## 2020-05-18 NOTE — Progress Notes (Signed)
GYNECOLOGY ANNUAL PREVENTATIVE CARE ENCOUNTER NOTE  History:     Nancy Mills is a 49 y.o. G2P2 female here for a routine annual gynecologic exam.  Current complaints: none.   Denies abnormal vaginal bleeding, discharge, pelvic pain, problems with intercourse or other gynecologic concerns.  She is here today for IUD removal and re-insertion.    Gynecologic History No LMP recorded. (Menstrual status: IUD). Contraception: IUD Last Pap: 2016. Results were: normal with negative HPV Last mammogram: NA- ordered today.   Obstetric History OB History  Gravida Para Term Preterm AB Living  2 2       2   SAB TAB Ectopic Multiple Live Births               # Outcome Date GA Lbr Len/2nd Weight Sex Delivery Anes PTL Lv  2 Para           1 Para             Past Medical History:  Diagnosis Date   Adjustment disorder with mixed anxiety and depressed mood    Anxiety    Bipolar 1 disorder (HCC)    Diabetes mellitus without complication (HCC)    HSV-2 infection    Liver cirrhosis secondary to NASH (HCC)    Severe obesity (HCC)    Thyroid disease     Past Surgical History:  Procedure Laterality Date   ABDOMINAL SURGERY     abdomnal wall reconstruction     CHOLECYSTECTOMY     RIGHT OOPHORECTOMY      Current Outpatient Medications on File Prior to Visit  Medication Sig Dispense Refill   albuterol (PROVENTIL HFA;VENTOLIN HFA) 108 (90 BASE) MCG/ACT inhaler Inhale 2 puffs into the lungs every 6 (six) hours as needed for wheezing.      ALPRAZolam (XANAX) 1 MG tablet Take 1 mg by mouth 2 (two) times daily as needed for anxiety.      cyclobenzaprine (FLEXERIL) 5 MG tablet Take 5 mg by mouth 3 (three) times daily as needed for muscle spasms.      doxycycline (VIBRAMYCIN) 100 MG capsule Take 1 capsule (100 mg total) by mouth 2 (two) times daily for 7 days. 14 capsule 0   folic acid (FOLVITE) 086 MCG tablet Take 400 mcg by mouth daily.     gabapentin (NEURONTIN) 600 MG  tablet Two tabs PO TID (Patient taking differently: Take 600 mg by mouth 3 (three) times daily. ) 180 tablet 3   hydrOXYzine (ATARAX/VISTARIL) 50 MG tablet Take 50 mg by mouth at bedtime as needed for anxiety.      lamoTRIgine (LAMICTAL) 200 MG tablet Take 200 mg by mouth 2 (two) times daily.      levonorgestrel (MIRENA) 20 MCG/24HR IUD 1 each by Intrauterine route once.     levothyroxine (SYNTHROID) 125 MCG tablet Take 125 mcg by mouth daily before breakfast.     Multiple Vitamins-Minerals (OPURITY BYPASS OPTIMIZED) CHEW Chew 1 tablet by mouth daily.      omeprazole (PRILOSEC) 20 MG capsule Take 20 mg by mouth daily.      ondansetron (ZOFRAN) 4 MG tablet Take 1 tablet (4 mg total) by mouth every 6 (six) hours. (Patient taking differently: Take 4 mg by mouth every 8 (eight) hours as needed for nausea. ) 12 tablet 0   Probiotic Product (PROBIOTIC-10 PO) Take 1 capsule by mouth daily.      senna-docusate (SENOKOT-S) 8.6-50 MG tablet Take 1 tablet by mouth daily.  traMADol (ULTRAM) 50 MG tablet Take 1-2 tablets (50-100 mg total) by mouth every 8 (eight) hours as needed for moderate pain. Maximum 6 tabs per day. 21 tablet 0   traZODone (DESYREL) 50 MG tablet Take 50 mg by mouth at bedtime as needed for sleep.      vitamin B-12 (CYANOCOBALAMIN) 100 MCG tablet Take 100 mcg by mouth daily.     No current facility-administered medications on file prior to visit.    Allergies  Allergen Reactions   Clindamycin/Lincomycin Diarrhea    Severe diarrhea, cannot tolerate   Latex Rash   Nsaids     Due to history of gastric bypass   Erythromycin    Penicillins    Chicken Allergy Other (See Comments)    Gastric bypass surgery, can not digest    Codeine    Morphine And Related Nausea And Vomiting    Social History:  reports that she has been smoking. She has a 10.00 pack-year smoking history. She has never used smokeless tobacco. She reports that she does not drink alcohol and  does not use drugs.  History reviewed. No pertinent family history.  The following portions of the patient's history were reviewed and updated as appropriate: allergies, current medications, past family history, past medical history, past social history, past surgical history and problem list.  Review of Systems Pertinent items noted in HPI and remainder of comprehensive ROS otherwise negative.  Physical Exam:  BP 120/82    Pulse 61    Ht 5' 6"  (1.676 m)    Wt 227 lb (103 kg)    BMI 36.64 kg/m  CONSTITUTIONAL: Well-developed, well-nourished female in no acute distress.  HENT:  Normocephalic, atraumatic, External right and left ear normal. Oropharynx is clear and moist EYES: Conjunctivae and EOM are normal. Pupils are equal, round, and reactive to light. No scleral icterus.  NECK: Normal range of motion, supple, no masses.  Normal thyroid.  SKIN: Skin is warm and dry. No rash noted. Not diaphoretic. No erythema. No pallor. MUSCULOSKELETAL: Normal range of motion. No tenderness.  No cyanosis, clubbing, or edema.  2+ distal pulses. NEUROLOGIC: Alert and oriented to person, place, and time. Normal reflexes, muscle tone coordination.  PSYCHIATRIC: Normal mood and affect. Normal behavior. Normal judgment and thought content. CARDIOVASCULAR: Normal heart rate noted, regular rhythm RESPIRATORY: Clear to auscultation bilaterally. Effort and breath sounds normal, no problems with respiration noted. BREASTS: Symmetric in size. No masses, tenderness, skin changes, nipple drainage, or lymphadenopathy bilaterally. Performed in the presence of a chaperone. ABDOMEN: Soft, no distention noted.  No tenderness, rebound or guarding.  PELVIC: Normal appearing external genitalia and urethral meatus; normal appearing vaginal mucosa and cervix.  No abnormal discharge noted.  Pap smear obtained.  Normal uterine size, no other palpable masses, no uterine or adnexal tenderness.  Performed in the presence of a  chaperone.  Attempted removal of IUD- unable to remove, despite cervical dilator.    Assessment and Plan:   1. Women's annual routine gynecological examination  - POCT urine pregnancy - Hepatitis C Antibody - HIV antibody (with reflex) - Urine Microalbumin w/creat. ratio - Cytology - PAP( [facility]) - MM Digital Screening; Future  Will follow up results of pap smear and manage accordingly. Mammogram scheduled Routine preventative health maintenance measures emphasized. Please refer to After Visit Summary for other counseling recommendations.     Alan Drummer, [place] for [employer], [place]   IUD Removal  Patient was  in the dorsal lithotomy position, normal external genitalia was noted. A speculum was placed in the patient's vagina, normal discharge was noted, no lesions. The nulliparous cervix was visualized, no lesions, no abnormal discharge. The strings of the IUD were grasped and pulled using ring forceps. IUD unable to be removed despite cervical dilator. IUD may be imbedded. Patient reports with her last removal she had to have Cytotec placed.   Noni Saupe I, NP 05/21/2020 10:10 AM

## 2020-05-19 LAB — MICROALBUMIN / CREATININE URINE RATIO
Creatinine, Urine: 160 mg/dL (ref 20–275)
Microalb Creat Ratio: 26 mcg/mg creat (ref <30)
Microalb, Ur: 4.2 mg/dL

## 2020-05-19 LAB — HEPATITIS C ANTIBODY
Hepatitis C Ab: NONREACTIVE
SIGNAL TO CUT-OFF: 0.02 (ref <1.00)

## 2020-05-19 LAB — HIV ANTIBODY (ROUTINE TESTING W REFLEX): HIV 1&2 Ab, 4th Generation: NONREACTIVE

## 2020-05-20 LAB — CYTOLOGY - PAP
Comment: NEGATIVE
Diagnosis: NEGATIVE
High risk HPV: NEGATIVE

## 2020-05-21 MED FILL — miSOPROStol 200 MCG TABS: 200 | 1 days supply | Qty: 3 | Fill #0

## 2020-05-22 LAB — CULTURE, BLOOD (ROUTINE X 2)
Culture: NO GROWTH
Culture: NO GROWTH
Special Requests: ADEQUATE

## 2020-05-23 ENCOUNTER — Ambulatory Visit: Payer: 59 | Admitting: Rehabilitative and Restorative Service Providers"

## 2020-05-25 ENCOUNTER — Other Ambulatory Visit: Payer: Self-pay

## 2020-05-25 ENCOUNTER — Encounter: Payer: Self-pay | Admitting: Rehabilitative and Restorative Service Providers"

## 2020-05-25 ENCOUNTER — Ambulatory Visit (INDEPENDENT_AMBULATORY_CARE_PROVIDER_SITE_OTHER): Payer: 59 | Admitting: Rehabilitative and Restorative Service Providers"

## 2020-05-25 DIAGNOSIS — M5410 Radiculopathy, site unspecified: Secondary | ICD-10-CM

## 2020-05-25 DIAGNOSIS — M6281 Muscle weakness (generalized): Secondary | ICD-10-CM

## 2020-05-25 DIAGNOSIS — R29898 Other symptoms and signs involving the musculoskeletal system: Secondary | ICD-10-CM | POA: Diagnosis not present

## 2020-05-25 DIAGNOSIS — M5416 Radiculopathy, lumbar region: Secondary | ICD-10-CM | POA: Diagnosis not present

## 2020-05-25 DIAGNOSIS — R293 Abnormal posture: Secondary | ICD-10-CM | POA: Diagnosis not present

## 2020-05-25 NOTE — Therapy (Addendum)
[place], [place], [address] Phone: [phone]   Fax:  [phone]  Physical Therapy Evaluation  Patient Details  Name: FIORELLA HANAHAN MRN: [id number] Date of Birth: [birth year] Referring Provider (PT): Dr Dianah Field    Encounter Date: 05/25/2020   PT End of Session - 05/25/20 1658    Visit Number 1    Number of Visits 12    Date for PT Re-Evaluation 07/06/20    PT Start Time 1512    PT Stop Time 1600    PT Time Calculation (min) 48 min    Activity Tolerance Patient limited by pain           Past Medical History:  Diagnosis Date  . Adjustment disorder with mixed anxiety and depressed mood   . Anxiety   . Bipolar 1 disorder ([place])   . Diabetes mellitus without complication ([place])   . HSV-2 infection   . Liver cirrhosis secondary to NASH ([place])   . Severe obesity ([place])   . Thyroid disease     Past Surgical History:  Procedure Laterality Date  . ABDOMINAL SURGERY    . abdomnal wall reconstruction    . CHOLECYSTECTOMY    . RIGHT OOPHORECTOMY      There were no vitals filed for this visit.    Subjective Assessment - 05/25/20 1514    Subjective Patient reports that she had L4/5 partial discectomy 9/20 with good improvement. She was rolling over in bed ~ 2 months ago when she heard a pop and some aching. She has had persistent pain which has improved with steroids but symptoms have returned. She is now wearing spanx all day every day and using topical analgesics on a daily basis as well as taking medicatins as prescribed.    Pertinent History gastric bypass sx 2019; LBP Rt IS inflammation 2019; abdominal hernia 2007 and abdominal wall reconstruction 2008; gall bladder 1993; 2 c-sections 20001 and 2004    Diagnostic tests MRI    Patient Stated Goals avoid spinbal fusion; relief from pain    Currently in Pain? Yes    Pain Score 2     Pain Location Back    Pain Orientation  Lower;Right;Left    Pain Descriptors / Indicators Aching;Tightness;Sharp    Pain Type Acute pain    Pain Radiating Towards intermittently to Rt LE    Pain Onset More than a month ago    Pain Frequency Intermittent    Aggravating Factors  changing positions; lifting; bending; reaching    Pain Relieving Factors meds; topical analgesic; spanx for compression              [facility] PT Assessment - 05/25/20 0001      Assessment   Medical Diagnosis Lumbar radiculaopthy Rt LE pain     Referring Provider (PT) Dr Dianah Field     Onset Date/Surgical Date 02/18/20    Hand Dominance Right    Next MD Visit to schedule     Prior Therapy yes prior to and after surgery       Precautions   Precautions None      Balance Screen   Has the patient fallen in the past 6 months Yes    How many times? 2   to ED last week fell to Lt syncope episode    Has the patient had a decrease in activity level because of a fear of falling?  No    Is the patient reluctant to  leave their home because of a fear of falling?  No      Prior Function   Level of Independence Independent    Vocation Full time employment    Vocation Requirements CMA     Leisure in [place] FT at night for the past 9 months; household chores; children teenagers       Observation/Other Assessments   Focus on Therapeutic Outcomes (FOTO)  48% limitation       Sensation   Additional Comments intermittent to Rt toes anterior thigh to lateral calf to dorsal foot and toes daily loasting for 2-4 hours resolves with change of positions       Posture/Postural Control   Posture Comments head forward; shoulders rounded; flexed at trunk      AROM   Right/Left Hip --   tight end range ext bilat; rotation Rt > Lt    Lumbar Flexion 50% pain LB to Rt posterior hip     Lumbar Extension 15% pain bilat lower sacral area    Lumbar - Right Side Bend 75% pain Rt LB/hip    Lumbar - Left Side Bend 70% pain Rt LB/hip > than to Rt     Lumbar - Right  Rotation 20%    Lumbar - Left Rotation 20%       Strength   Right Hip Flexion 4+/5    Right Hip Extension 4+/5    Right Hip ABduction 4+/5    Left Hip Flexion 5/5    Left Hip Extension 5/5    Left Hip ABduction 5/5    Right Knee Flexion --   5-/5   Right Knee Extension 4+/5    Left Knee Flexion 5/5    Left Knee Extension 5/5      Flexibility   Hamstrings tight end ranges Rt > Lt    Quadriceps tight Rt heel ~ 7 in from buttocks; Lt 4 inches     ITB tight Rt     Piriformis tight Rt > Lt       Palpation   Spinal mobility hypomobility noted through lumbar spine with CPA mobs pain at L3/4//5 and S1    Palpation comment muscular tightness Rt > Lt psoas; QL'; lumbar paraspinals; piriformis; glut med       Special Tests   Other special tests (-) SLR; (+) FABERS Rt with pain into the anterior Rt thigh      Transfers   Comments painful and slow with all transitional movements with poor body mechanics       Ambulation/Gait   Gait Comments abnormal gait with limp Rt LE in wt bearing Rt       Balance   Balance Assessed --   SLS ~ 10 sec bilat                      Objective measurements completed on examination: See above findings.       [place] [facility] Adult PT Treatment/Exercise - 05/25/20 0001      Self-Care   Self-Care ADL's   education on sitting posture and alignment   ADL's discussed posture and alignment - to avoid prolonged sitting; avoid flexion; flexed forward postures and bent forward twisted postures - patient will try to stand from seated position every 20-30 min       Neuro Re-ed    Neuro Re-ed Details  initiated work on posture and alignment with core engaged and improved lordosis to achieve more neutral lumbar spine position  Lumbar Exercises: Stretches   Standing Extension 2 reps   2-3 sec    Prone on Elbows Stretch 3 reps;30 seconds      Lumbar Exercises: Supine   AB Set Limitations 4 part core 10 sec hold x 10 reps                    PT Education - 05/25/20 1559    Education Details HEP POC    Person(s) Educated Patient    Methods Explanation;Demonstration;Tactile cues;Verbal cues;Handout    Comprehension Verbalized understanding;Returned demonstration;Verbal cues required;Tactile cues required               PT Long Term Goals - 05/25/20 1715      PT LONG TERM GOAL #1   Title Decrease pai n50-75% allowing patient to progress with core stabilization program and return to normal functional activities    Time 6    Period Weeks    Status New    Target Date 07/06/20      PT LONG TERM GOAL #2   Title Increase trunk mobility and ROM to WFL's and painfree    Time 6    Period Weeks    Status New    Target Date 07/06/20      PT LONG TERM GOAL #3   Title 5/5 Rt LE strength throughout    Time 6    Period Weeks    Status New    Target Date 07/06/20      PT LONG TERM GOAL #4   Title Independent in HEP    Time 6    Period Weeks    Status New    Target Date 07/06/20      PT LONG TERM GOAL #5   Title Improve FOTO to </= 38% limitation    Time 6    Period Weeks    Status New    Target Date 07/06/20                  Plan - 05/25/20 1659    Clinical Impression Statement Patient presents with 2-3 month history of Rt lumbar radiculopathy. Symptoms improved with use of steroid dose pack and medications but has returned. She has poor posture standing and sitting with decreased lumbar lordosis; limited trunk and LE mobility/ROM; LE weakness; pain in the Rt/Lt lower lumbar/sacral spine and Rt posterior SI to Rt hip area; pain with functional activiites. She is working and in school full time requiring significant amounts of time sitting. She also has minimal to no time to commit to exercise, specifically core stabilization and strengthening - which is important to resolving current problems and preventing recurrent problems. Patient has a history of prior L4/5 hemi-lamenectomy and multiple abdominal surgeries  creating weak core. She will benefit from PT to address problems identified.    Stability/Clinical Decision Making Stable/Uncomplicated    Clinical Decision Making Low    Rehab Potential Good    PT Frequency 2x / week    PT Duration 6 weeks    PT Treatment/Interventions Patient/family education;ADLs/Self Care Home Management;Aquatic Therapy;Cryotherapy;Electrical Stimulation;Iontophoresis 39m/ml Dexamethasone;Moist Heat;Traction;Ultrasound;Functional mobility training;Therapeutic activities;Therapeutic exercise;Balance training;Neuromuscular re-education;Manual techniques;Dry needling;Taping    PT Next Visit Plan review HEP; assess response to extension program; DN/manual work Rt > t lumbar and posterior hip areas; core stabilization    PT Home Exercise Plan [email or web address]    Consulted and Agree with Plan of Care Patient           Patient will  benefit from skilled therapeutic intervention in order to improve the following deficits and impairments:  Decreased range of motion, Decreased activity tolerance, Pain, Hypomobility, Improper body mechanics, Impaired flexibility, Decreased mobility, Decreased strength, Postural dysfunction  Visit Diagnosis: Radiculopathy, lumbar region - Plan: PT plan of care cert/re-cert  Radicular leg pain - Plan: PT plan of care cert/re-cert  Muscle weakness (generalized) - Plan: PT plan of care cert/re-cert  Other symptoms and signs involving the musculoskeletal system - Plan: PT plan of care cert/re-cert  Abnormal posture - Plan: PT plan of care cert/re-cert     Problem List Patient Active Problem List   Diagnosis Date Noted  . S/P gastric bypass 04/26/2020  . Spinal stenosis of lumbar region 03/05/2019  . Lumbar radiculopathy 03/05/2019  . Bipolar 1 disorder ([place]) 03/27/2018  . HSV-2 infection 05/21/2017  . Liver cirrhosis secondary to NASH ([place]) 05/09/2016  . Portal venous hypertension ([place]) 05/09/2016  . Irritable bowel syndrome with diarrhea  05/09/2016  . Hypothyroidism 02/09/2016    Delylah Stanczyk Nilda Simmer PT, MPH  05/25/2020, 5:21 PM  [facility] [place], [place], [address] Phone: [phone]   Fax:  [phone]  Name: REETA KUK MRN: [id number] Date of Birth: [birth year] PHYSICAL THERAPY DISCHARGE SUMMARY  Visits from Start of Care: Evaluation only  Current functional level related to goals / functional outcomes: See eval   Remaining deficits: Unchanged   Education / Equipment: Initial HEP  Plan: Patient agrees to discharge.  Patient goals were not met. Patient is being discharged due to not returning since the last visit.  ?????     Rosaleen Mazer P. Helene Kelp PT, MPH 09/19/20 10:49 AM

## 2020-05-25 NOTE — Patient Instructions (Signed)
Access Code: [email or web address]: [email or web address]: 07/21/2021Prepared by: Emely Fahy HoltExercises  Supine Transversus Abdominis Bracing with Pelvic Floor Contraction - 2 x daily - 7 x weekly - 1 sets - 10 reps - 10sec hold  Prone Press Up on Elbows - 2 x daily - 7 x weekly - 1 sets - 3 reps - 30 sec hold Patient Education  Biomedical scientist

## 2020-05-30 ENCOUNTER — Ambulatory Visit: Payer: 59 | Admitting: Rehabilitative and Restorative Service Providers"

## 2020-06-01 ENCOUNTER — Encounter: Payer: 59 | Admitting: Rehabilitative and Restorative Service Providers"

## 2020-06-01 ENCOUNTER — Other Ambulatory Visit: Payer: Self-pay

## 2020-06-01 ENCOUNTER — Ambulatory Visit (INDEPENDENT_AMBULATORY_CARE_PROVIDER_SITE_OTHER): Payer: 59 | Admitting: Medical-Surgical

## 2020-06-01 ENCOUNTER — Encounter: Payer: Self-pay | Admitting: Medical-Surgical

## 2020-06-01 VITALS — BP 118/66 | HR 84 | Temp 98.7°F | Wt 219.0 lb

## 2020-06-01 DIAGNOSIS — N898 Other specified noninflammatory disorders of vagina: Secondary | ICD-10-CM | POA: Diagnosis not present

## 2020-06-01 LAB — WET PREP FOR TRICH, YEAST, CLUE
MICRO NUMBER:: 10759604
Specimen Quality: ADEQUATE

## 2020-06-01 NOTE — Progress Notes (Signed)
Subjective:    CC: vaginal itching and discharge  HPI: Pleasant 49 year old female presenting today for evaluation of vaginal itching and discharge.  Notes that she did have some upset stomach and the pain syndrome and she is on day night.  She did have some incontinence and ended up taking 3 showers.  Her GI issues have resolved but unfortunately she began itching in the vaginal area yesterday.  The itching progressed throughout the day and she noticed that she was wiping so much last night that there was blood on the tissue.  She did take a Diflucan last night and used miconazole cream to the external vaginal area.  So far she has not seen much relief of her symptoms.  Notes that the internal discharge is milky, white but there is thick, chunky white discharge on the external surfaces.  No vaginal odor noted.  Takes a probiotic every day.  Was previously eating yogurt and cottage cheese daily but has not had this in approximately 1 week.  Is currently sexually active but notes that there is no concern for possible STIs.  Denies fever and chills.  I reviewed the past medical history, family history, social history, surgical history, and allergies today and no changes were needed.  Please see the problem list section below in epic for further details.  Past Medical History: Past Medical History:  Diagnosis Date  . Adjustment disorder with mixed anxiety and depressed mood   . Anxiety   . Bipolar 1 disorder ([place])   . Diabetes mellitus without complication ([place])   . HSV-2 infection   . Liver cirrhosis secondary to NASH ([place])   . Severe obesity ([place])   . Thyroid disease    Past Surgical History: Past Surgical History:  Procedure Laterality Date  . ABDOMINAL SURGERY    . abdomnal wall reconstruction    . CHOLECYSTECTOMY    . RIGHT OOPHORECTOMY     Social History: Social History   Socioeconomic History  . Marital status: Married    Spouse name: Not on file  . Number of children: Not on  file  . Years of education: Not on file  . Highest education level: Not on file  Occupational History  . Not on file  Tobacco Use  . Smoking status: Current Every Day Smoker    Packs/day: 0.50    Years: 20.00    Pack years: 10.00  . Smokeless tobacco: Never Used  Substance and Sexual Activity  . Alcohol use: No  . Drug use: No  . Sexual activity: Yes    Birth control/protection: I.U.D.  Other Topics Concern  . Not on file  Social History Narrative  . Not on file   Social Determinants of Health   Financial Resource Strain:   . Difficulty of Paying Living Expenses:   Food Insecurity:   . Worried About Charity fundraiser in the Last Year:   . Arboriculturist in the Last Year:   Transportation Needs:   . Film/video editor (Medical):   Marland Kitchen Lack of Transportation (Non-Medical):   Physical Activity:   . Days of Exercise per Week:   . Minutes of Exercise per Session:   Stress:   . Feeling of Stress :   Social Connections:   . Frequency of Communication with Friends and Family:   . Frequency of Social Gatherings with Friends and Family:   . Attends Religious Services:   . Active Member of Clubs or Organizations:   . Attends  Club or Organization Meetings:   Marland Kitchen Marital Status:    Family History: No family history on file. Allergies: Allergies  Allergen Reactions  . Clindamycin/Lincomycin Diarrhea    Severe diarrhea, cannot tolerate  . Latex Rash  . Nsaids     Due to history of gastric bypass  . Erythromycin   . Penicillins   . Chicken Allergy Other (See Comments)    Gastric bypass surgery, can not digest   . Codeine   . Morphine And Related Nausea And Vomiting   Medications: See med rec.  Review of Systems: See HPI for pertinent positives and negatives.   Objective:    General: Well Developed, well nourished, and in no acute distress.  Neuro: Alert and oriented x3.  HEENT: Normocephalic, atraumatic.  Skin: Warm and dry. Cardiac: Regular rate and rhythm,  no murmurs rubs or gallops, no lower extremity edema.  Respiratory: Clear to auscultation bilaterally. Not using accessory muscles, speaking in full sentences.   Impression and Recommendations:    1. Vaginal itching/discharge Stat wet prep performed with self swab.  Further management pending results. - WET PREP FOR [place]  Return if symptoms worsen or fail to improve. ___________________________________________ Clearnce Sorrel, DNP, APRN, FNP-BC [facility] and [place]

## 2020-06-06 ENCOUNTER — Encounter: Payer: 59 | Admitting: Rehabilitative and Restorative Service Providers"

## 2020-06-06 ENCOUNTER — Encounter: Payer: Self-pay | Admitting: Family Medicine

## 2020-06-07 ENCOUNTER — Other Ambulatory Visit: Payer: Self-pay

## 2020-06-07 ENCOUNTER — Other Ambulatory Visit: Payer: Self-pay | Admitting: Family Medicine

## 2020-06-07 MED ORDER — LAMOTRIGINE 200 MG PO TABS: 200.0000 mg | ORAL_TABLET | Freq: Two times a day (BID) | ORAL | 1 refills | Status: DC

## 2020-06-07 MED FILL — SUBVENITE 200 MG TABS: 200 | 90 days supply | Qty: 180 | Fill #0

## 2020-06-08 ENCOUNTER — Encounter: Payer: 59 | Admitting: Physical Therapy

## 2020-06-09 ENCOUNTER — Encounter: Payer: Self-pay | Admitting: Nurse Practitioner

## 2020-06-12 ENCOUNTER — Encounter: Payer: Self-pay | Admitting: Family Medicine

## 2020-06-13 ENCOUNTER — Other Ambulatory Visit: Payer: Self-pay

## 2020-06-13 MED ORDER — OMEPRAZOLE 20 MG PO CPDR: 20.0000 mg | DELAYED_RELEASE_CAPSULE | Freq: Every day | ORAL | 1 refills | Status: DC

## 2020-06-13 MED FILL — OMEPRAZOLE 20 MG CAP: 20 | 90 days supply | Qty: 90 | Fill #0

## 2020-06-15 ENCOUNTER — Encounter: Payer: Self-pay | Admitting: Obstetrics and Gynecology

## 2020-06-15 ENCOUNTER — Ambulatory Visit: Payer: 59 | Admitting: Obstetrics and Gynecology

## 2020-06-15 ENCOUNTER — Other Ambulatory Visit: Payer: Self-pay

## 2020-06-15 VITALS — BP 118/78 | HR 71 | Resp 16 | Ht 66.0 in | Wt 227.0 lb

## 2020-06-15 DIAGNOSIS — Z01812 Encounter for preprocedural laboratory examination: Secondary | ICD-10-CM | POA: Diagnosis not present

## 2020-06-15 DIAGNOSIS — Z30430 Encounter for insertion of intrauterine contraceptive device: Secondary | ICD-10-CM

## 2020-06-15 LAB — POCT URINE PREGNANCY: Preg Test, Ur: NEGATIVE

## 2020-06-15 MED ORDER — LEVONORGESTREL 20 MCG/24HR IU IUD: INTRAUTERINE_SYSTEM | Freq: Once | INTRAUTERINE | Status: AC

## 2020-06-15 NOTE — Progress Notes (Signed)
     GYNECOLOGY OFFICE PROCEDURE NOTE  Nancy Mills is a 49 y.o. G2P2 here for mirena IUD removal and reinsertion. No GYN concerns.  She inserted cytotec vaginally prior to removal. D/t history of difficulty removing.  I attempted to remove her IUD on 7/14 and was unable.   IUD Removal and Reinsertion  Patient identified, informed consent performed, consent signed.   Discussed risks of irregular bleeding, cramping, infection, malpositioning or misplacement of the IUD outside the uterus which may require further procedures. Also discussed >99% contraception efficacy, increased risk of ectopic pregnancy with failure of method.   Emphasized that this did not protect against STIs, condoms recommended during all sexual encounters.Advised to use backup contraception for one week as the risk of pregnancy is higher during the transition period of removing an IUD and replacing it with another one. Time out was performed. Speculum placed in the vagina. The strings of the IUD were grasped and pulled using ring forceps. The IUD was successfully removed in its entirety. The cervix was cleaned with Betadine x 2 and grasped anteriorly with a single tooth tenaculum.  The new mirena IUD insertion apparatus was used to sound the uterus to 7 cm;  the IUD was then placed per manufacturer's recommendations. Strings trimmed to 3 cm. Tenaculum was removed, good hemostasis noted. Patient tolerated procedure well.   Patient was given post-procedure instructions.  She was reminded to have backup contraception for one week during this transition period between IUDs.  Patient was also asked to check IUD strings periodically and follow up in 4 weeks for IUD check.  Cylee Dattilo, Artist Pais, [place] for [employer], [place]

## 2020-06-22 ENCOUNTER — Ambulatory Visit: Payer: 59 | Admitting: Sports Medicine

## 2020-06-23 ENCOUNTER — Other Ambulatory Visit (HOSPITAL_COMMUNITY): Payer: Self-pay | Admitting: Nurse Practitioner

## 2020-06-23 DIAGNOSIS — Z713 Dietary counseling and surveillance: Secondary | ICD-10-CM | POA: Diagnosis not present

## 2020-06-23 DIAGNOSIS — Z9884 Bariatric surgery status: Secondary | ICD-10-CM | POA: Diagnosis not present

## 2020-06-29 ENCOUNTER — Other Ambulatory Visit: Payer: Self-pay | Admitting: Obstetrics and Gynecology

## 2020-06-29 ENCOUNTER — Ambulatory Visit (INDEPENDENT_AMBULATORY_CARE_PROVIDER_SITE_OTHER): Payer: 59

## 2020-06-29 ENCOUNTER — Other Ambulatory Visit: Payer: Self-pay

## 2020-06-29 DIAGNOSIS — Z01419 Encounter for gynecological examination (general) (routine) without abnormal findings: Secondary | ICD-10-CM

## 2020-06-29 DIAGNOSIS — Z1231 Encounter for screening mammogram for malignant neoplasm of breast: Secondary | ICD-10-CM | POA: Diagnosis not present

## 2020-07-01 ENCOUNTER — Other Ambulatory Visit: Payer: Self-pay | Admitting: Obstetrics and Gynecology

## 2020-07-01 ENCOUNTER — Telehealth: Payer: Self-pay | Admitting: *Deleted

## 2020-07-01 DIAGNOSIS — R928 Other abnormal and inconclusive findings on diagnostic imaging of breast: Secondary | ICD-10-CM

## 2020-07-01 NOTE — Telephone Encounter (Signed)
Left patient an urgent message to call and reschedule appointment on 07/14/20 at 4:00 PM with Dr. Rosana Hoes.

## 2020-07-12 ENCOUNTER — Ambulatory Visit (INDEPENDENT_AMBULATORY_CARE_PROVIDER_SITE_OTHER): Payer: 59 | Admitting: Family Medicine

## 2020-07-12 ENCOUNTER — Other Ambulatory Visit: Payer: Self-pay

## 2020-07-12 VITALS — BP 104/72 | HR 68 | Temp 98.4°F | Ht 66.0 in | Wt 223.4 lb

## 2020-07-12 DIAGNOSIS — F319 Bipolar disorder, unspecified: Secondary | ICD-10-CM | POA: Diagnosis not present

## 2020-07-12 DIAGNOSIS — Z13220 Encounter for screening for lipoid disorders: Secondary | ICD-10-CM | POA: Diagnosis not present

## 2020-07-12 DIAGNOSIS — E039 Hypothyroidism, unspecified: Secondary | ICD-10-CM | POA: Diagnosis not present

## 2020-07-12 DIAGNOSIS — Z23 Encounter for immunization: Secondary | ICD-10-CM | POA: Diagnosis not present

## 2020-07-12 DIAGNOSIS — Z9884 Bariatric surgery status: Secondary | ICD-10-CM | POA: Diagnosis not present

## 2020-07-12 DIAGNOSIS — Z0001 Encounter for general adult medical examination with abnormal findings: Secondary | ICD-10-CM

## 2020-07-12 DIAGNOSIS — R739 Hyperglycemia, unspecified: Secondary | ICD-10-CM

## 2020-07-12 MED ORDER — ALPRAZOLAM 1 MG PO TABS: 1.0000 mg | ORAL_TABLET | Freq: Two times a day (BID) | ORAL | 1 refills | Status: DC | PRN

## 2020-07-12 MED FILL — ALPRAZolam 1 MG TABS: 1 | 90 days supply | Qty: 180 | Fill #0

## 2020-07-12 NOTE — Progress Notes (Signed)
Chief Complaint:  Nancy Mills is a 49 y.o. female who presents today for her annual comprehensive physical exam.    Assessment/Plan:  Chronic Problems Addressed Today: S/P gastric bypass Will place referral to weight management.  Continue omeprazole 20 mg daily and vitamin supplementation.  Hypothyroidism Continue Synthroid 200 mcg daily. Check TSH.   Bipolar 1 disorder (HCC) Symptoms are stable.  Database with no red flags.  Will refill Xanax today.  Follow-up in 6 months.  Continue current dose of Lamictal 300 mg daily.  Also continue hydroxyzine 50 mg daily and trazodone 50 mg nightly as needed.   Body mass index is 36.06 kg/m. / Obese  BMI Metric Follow Up - 07/12/20 0944      BMI Metric Follow Up-Please document annually   BMI Metric Follow Up Education provided            Preventative Healthcare: Flu vaccine today.  Has had allergic reaction to first dose of either vaccine-she will not be getting a second dose.  Recently had mammogram and Pap smear performed.  Will check labs today including CBC, CMET, lipid panel, TSH.  Patient Counseling(The following topics were reviewed and/or handout was given):  -Nutrition: Stressed importance of moderation in sodium/caffeine intake, saturated fat and cholesterol, caloric balance, sufficient intake of fresh fruits, vegetables, and fiber.  -Stressed the importance of regular exercise.   -Substance Abuse: Discussed cessation/primary prevention of tobacco, alcohol, or other drug use; driving or other dangerous activities under the influence; availability of treatment for abuse.   -Injury prevention: Discussed safety belts, safety helmets, smoke detector, smoking near bedding or upholstery.   -Sexuality: Discussed sexually transmitted diseases, partner selection, use of condoms, avoidance of unintended pregnancy and contraceptive alternatives.   -Dental health: Discussed importance of regular tooth brushing, flossing, and dental  visits.  -Health maintenance and immunizations reviewed. Please refer to Health maintenance section.  Return to care in 1 year for next preventative visit.     Subjective:  HPI:  She has no acute complaints today. See A/p for status of chronic conditions.    Lifestyle Diet: Working with a Engineer, maintenance (IT).  Exercise: Likes to walk.   No flowsheet data found.  There are no preventive care reminders to display for this patient.   ROS: Per HPI, otherwise a complete review of systems was negative.   PMH:  The following were reviewed and entered/updated in epic: Past Medical History:  Diagnosis Date  . Adjustment disorder with mixed anxiety and depressed mood   . Anxiety   . Bipolar 1 disorder ([place])   . Diabetes mellitus without complication ([place])   . HSV-2 infection   . Liver cirrhosis secondary to NASH ([place])   . Severe obesity ([place])   . Thyroid disease    Patient Active Problem List   Diagnosis Date Noted  . S/P gastric bypass 04/26/2020  . Spinal stenosis of lumbar region 03/05/2019  . Lumbar radiculopathy 03/05/2019  . Bipolar 1 disorder ([place]) 03/27/2018  . HSV-2 infection 05/21/2017  . Liver cirrhosis secondary to NASH ([place]) 05/09/2016  . Portal venous hypertension ([place]) 05/09/2016  . Irritable bowel syndrome with diarrhea 05/09/2016  . Hypothyroidism 02/09/2016   Past Surgical History:  Procedure Laterality Date  . ABDOMINAL SURGERY    . abdomnal wall reconstruction    . CHOLECYSTECTOMY    . RIGHT OOPHORECTOMY      No family history on file.  Medications- reviewed and updated Current Outpatient Medications  Medication Sig Dispense Refill  .  albuterol (PROVENTIL HFA;VENTOLIN HFA) 108 (90 BASE) MCG/ACT inhaler Inhale 2 puffs into the lungs every 6 (six) hours as needed for wheezing.     Marland Kitchen ALPRAZolam (XANAX) 1 MG tablet Take 1 tablet (1 mg total) by mouth 2 (two) times daily as needed for anxiety. 485 tablet 1  . folic acid (FOLVITE) 462 MCG tablet Take 400 mcg  by mouth daily.    Marland Kitchen gabapentin (NEURONTIN) 600 MG tablet Two tabs PO TID (Patient taking differently: Take 600 mg by mouth 3 (three) times daily. ) 180 tablet 3  . hydrOXYzine (ATARAX/VISTARIL) 50 MG tablet Take 50 mg by mouth at bedtime as needed for anxiety.     . lamoTRIgine (LAMICTAL) 200 MG tablet Take 1 tablet (200 mg total) by mouth 2 (two) times daily. 180 tablet 1  . levonorgestrel (MIRENA) 20 MCG/24HR IUD 1 each by Intrauterine route once.    . Multiple Vitamins-Minerals (OPURITY BYPASS OPTIMIZED) CHEW Chew 1 tablet by mouth daily.     Marland Kitchen omeprazole (PRILOSEC) 20 MG capsule Take 1 capsule (20 mg total) by mouth daily. 90 capsule 1  . ondansetron (ZOFRAN) 4 MG tablet Take 1 tablet (4 mg total) by mouth every 6 (six) hours. (Patient taking differently: Take 4 mg by mouth every 8 (eight) hours as needed for nausea. ) 12 tablet 0  . Probiotic Product (PROBIOTIC-10 PO) Take 1 capsule by mouth daily.     Marland Kitchen senna-docusate (SENOKOT-S) 8.6-50 MG tablet Take 1 tablet by mouth daily.     Marland Kitchen SYNTHROID 200 MCG tablet Take 200 mcg by mouth daily.    . traMADol (ULTRAM) 50 MG tablet Take 1-2 tablets (50-100 mg total) by mouth every 8 (eight) hours as needed for moderate pain. Maximum 6 tabs per day. 21 tablet 0  . traZODone (DESYREL) 50 MG tablet Take 50 mg by mouth at bedtime as needed for sleep.     . vitamin B-12 (CYANOCOBALAMIN) 100 MCG tablet Take 100 mcg by mouth daily.     No current facility-administered medications for this visit.    Allergies-reviewed and updated Allergies  Allergen Reactions  . Other Anaphylaxis    Covi-19 Vaccine (Pfiser)  . Clindamycin/Lincomycin Diarrhea    Severe diarrhea, cannot tolerate  . Latex Rash  . Nsaids     Due to history of gastric bypass  . Erythromycin   . Penicillins   . Chicken Allergy Other (See Comments)    Gastric bypass surgery, can not digest   . Codeine   . Morphine And Related Nausea And Vomiting    Social History   Socioeconomic  History  . Marital status: Legally Separated    Spouse name: Not on file  . Number of children: Not on file  . Years of education: Not on file  . Highest education level: Not on file  Occupational History  . Not on file  Tobacco Use  . Smoking status: Current Every Day Smoker    Packs/day: 0.50    Years: 20.00    Pack years: 10.00  . Smokeless tobacco: Never Used  Substance and Sexual Activity  . Alcohol use: No  . Drug use: No  . Sexual activity: Yes    Birth control/protection: I.U.D.  Other Topics Concern  . Not on file  Social History Narrative  . Not on file   Social Determinants of Health   Financial Resource Strain:   . Difficulty of Paying Living Expenses: Not on file  Food Insecurity:   . Worried About  Running Out of Food in the Last Year: Not on file  . Ran Out of Food in the Last Year: Not on file  Transportation Needs:   . Lack of Transportation (Medical): Not on file  . Lack of Transportation (Non-Medical): Not on file  Physical Activity:   . Days of Exercise per Week: Not on file  . Minutes of Exercise per Session: Not on file  Stress:   . Feeling of Stress : Not on file  Social Connections:   . Frequency of Communication with Friends and Family: Not on file  . Frequency of Social Gatherings with Friends and Family: Not on file  . Attends Religious Services: Not on file  . Active Member of Clubs or Organizations: Not on file  . Attends Archivist Meetings: Not on file  . Marital Status: Not on file        Objective:  Physical Exam: BP 104/72   Pulse 68   Temp 98.4 F (36.9 C) (Temporal)   Ht 5' 6"  (1.676 m)   Wt 223 lb 6.4 oz (101.3 kg)   SpO2 98%   BMI 36.06 kg/m   Body mass index is 36.06 kg/m. Wt Readings from Last 3 Encounters:  07/12/20 223 lb 6.4 oz (101.3 kg)  06/15/20 227 lb (103 kg)  06/01/20 (!) 219 lb (99.3 kg)   Gen: NAD, resting comfortably HEENT: TMs normal bilaterally. OP clear. No thyromegaly noted.  CV: RRR  with no murmurs appreciated Pulm: NWOB, CTAB with no crackles, wheezes, or rhonchi GI: Normal bowel sounds present. Soft, Nontender, Nondistended. MSK: no edema, cyanosis, or clubbing noted Skin: warm, dry Neuro: CN2-12 grossly intact. Strength 5/5 in upper and lower extremities. Reflexes symmetric and intact bilaterally.  Psych: Normal affect and thought content     Larah Kuntzman M. Jerline Pain, MD 07/12/2020 9:45 AM

## 2020-07-12 NOTE — Assessment & Plan Note (Signed)
Continue Synthroid 200 mcg daily. Check TSH.

## 2020-07-12 NOTE — Assessment & Plan Note (Signed)
Will place referral to weight management.  Continue omeprazole 20 mg daily and vitamin supplementation.

## 2020-07-12 NOTE — Assessment & Plan Note (Signed)
Symptoms are stable.  Database with no red flags.  Will refill Xanax today.  Follow-up in 6 months.  Continue current dose of Lamictal 300 mg daily.  Also continue hydroxyzine 50 mg daily and trazodone 50 mg nightly as needed.

## 2020-07-12 NOTE — Patient Instructions (Signed)
It was very nice to see you today!  We will refill your Xanax.  Also place referral for you to see the weight management clinic.  We will check blood work today.  I will see you back in 6 months for your follow-up appointment.  I will see you back in 1 year for your next physical.  Take care, Dr Jerline Pain  Please try these tips to maintain a healthy lifestyle:   Eat at least 3 REAL meals and 1-2 snacks per day.  Aim for no more than 5 hours between eating.  If you eat breakfast, please do so within one hour of getting up.    Each meal should contain half fruits/vegetables, one quarter protein, and one quarter carbs (no bigger than a computer mouse)   Cut down on sweet beverages. This includes juice, soda, and sweet tea.     Drink at least 1 glass of water with each meal and aim for at least 8 glasses per day   Exercise at least 150 minutes every week.    Preventive Care 47-45 Years Old, Female Preventive care refers to visits with your health care provider and lifestyle choices that can promote health and wellness. This includes:  A yearly physical exam. This may also be called an annual well check.  Regular dental visits and eye exams.  Immunizations.  Screening for certain conditions.  Healthy lifestyle choices, such as eating a healthy diet, getting regular exercise, not using drugs or products that contain nicotine and tobacco, and limiting alcohol use. What can I expect for my preventive care visit? Physical exam Your health care provider will check your:  Height and weight. This may be used to calculate body mass index (BMI), which tells if you are at a healthy weight.  Heart rate and blood pressure.  Skin for abnormal spots. Counseling Your health care provider may ask you questions about your:  Alcohol, tobacco, and drug use.  Emotional well-being.  Home and relationship well-being.  Sexual activity.  Eating habits.  Work and work  Statistician.  Method of birth control.  Menstrual cycle.  Pregnancy history. What immunizations do I need?  Influenza (flu) vaccine  This is recommended every year. Tetanus, diphtheria, and pertussis (Tdap) vaccine  You may need a Td booster every 10 years. Varicella (chickenpox) vaccine  You may need this if you have not been vaccinated. Zoster (shingles) vaccine  You may need this after age 47. Measles, mumps, and rubella (MMR) vaccine  You may need at least one dose of MMR if you were born in 1957 or later. You may also need a second dose. Pneumococcal conjugate (PCV13) vaccine  You may need this if you have certain conditions and were not previously vaccinated. Pneumococcal polysaccharide (PPSV23) vaccine  You may need one or two doses if you smoke cigarettes or if you have certain conditions. Meningococcal conjugate (MenACWY) vaccine  You may need this if you have certain conditions. Hepatitis A vaccine  You may need this if you have certain conditions or if you travel or work in places where you may be exposed to hepatitis A. Hepatitis B vaccine  You may need this if you have certain conditions or if you travel or work in places where you may be exposed to hepatitis B. Haemophilus influenzae type b (Hib) vaccine  You may need this if you have certain conditions. Human papillomavirus (HPV) vaccine  If recommended by your health care provider, you may need three doses over 6 months.  You may receive vaccines as individual doses or as more than one vaccine together in one shot (combination vaccines). Talk with your health care provider about the risks and benefits of combination vaccines. What tests do I need? Blood tests  Lipid and cholesterol levels. These may be checked every 5 years, or more frequently if you are over 37 years old.  Hepatitis C test.  Hepatitis B test. Screening  Lung cancer screening. You may have this screening every year starting at  age 27 if you have a 30-pack-year history of smoking and currently smoke or have quit within the past 15 years.  Colorectal cancer screening. All adults should have this screening starting at age 33 and continuing until age 91. Your health care provider may recommend screening at age 29 if you are at increased risk. You will have tests every 1-10 years, depending on your results and the type of screening test.  Diabetes screening. This is done by checking your blood sugar (glucose) after you have not eaten for a while (fasting). You may have this done every 1-3 years.  Mammogram. This may be done every 1-2 years. Talk with your health care provider about when you should start having regular mammograms. This may depend on whether you have a family history of breast cancer.  BRCA-related cancer screening. This may be done if you have a family history of breast, ovarian, tubal, or peritoneal cancers.  Pelvic exam and Pap test. This may be done every 3 years starting at age 46. Starting at age 33, this may be done every 5 years if you have a Pap test in combination with an HPV test. Other tests  Sexually transmitted disease (STD) testing.  Bone density scan. This is done to screen for osteoporosis. You may have this scan if you are at high risk for osteoporosis. Follow these instructions at home: Eating and drinking  Eat a diet that includes fresh fruits and vegetables, whole grains, lean protein, and low-fat dairy.  Take vitamin and mineral supplements as recommended by your health care provider.  Do not drink alcohol if: ? Your health care provider tells you not to drink. ? You are pregnant, may be pregnant, or are planning to become pregnant.  If you drink alcohol: ? Limit how much you have to 0-1 drink a day. ? Be aware of how much alcohol is in your drink. In the [place] one drink equals one 12 oz bottle of beer (355 mL), one 5 oz glass of wine (148 mL), or one 1 oz glass of hard liquor  (44 mL). Lifestyle  Take daily care of your teeth and gums.  Stay active. Exercise for at least 30 minutes on 5 or more days each week.  Do not use any products that contain nicotine or tobacco, such as cigarettes, e-cigarettes, and chewing tobacco. If you need help quitting, ask your health care provider.  If you are sexually active, practice safe sex. Use a condom or other form of birth control (contraception) in order to prevent pregnancy and STIs (sexually transmitted infections).  If told by your health care provider, take low-dose aspirin daily starting at age 41. What's next?  Visit your health care provider once a year for a well check visit.  Ask your health care provider how often you should have your eyes and teeth checked.  Stay up to date on all vaccines. This information is not intended to replace advice given to you by your health care provider. Make sure  you discuss any questions you have with your health care provider. Document Revised: 07/03/2018 Document Reviewed: 07/03/2018 Elsevier Patient Education  [place]. h

## 2020-07-13 ENCOUNTER — Encounter (INDEPENDENT_AMBULATORY_CARE_PROVIDER_SITE_OTHER): Payer: Self-pay

## 2020-07-13 LAB — HEMOGLOBIN A1C
Hgb A1c MFr Bld: 5.3 % of total Hgb (ref <5.7)
Mean Plasma Glucose: 105 (calc)
eAG (mmol/L): 5.8 (calc)

## 2020-07-13 LAB — LIPID PANEL
Cholesterol: 137 mg/dL (ref <200)
HDL: 45 mg/dL — ABNORMAL LOW (ref >=50)
LDL Cholesterol (Calc): 75 mg/dL (calc)
Non-HDL Cholesterol (Calc): 92 mg/dL (calc) (ref <130)
Total CHOL/HDL Ratio: 3 (calc) (ref <5.0)
Triglycerides: 85 mg/dL (ref <150)

## 2020-07-13 LAB — COMPREHENSIVE METABOLIC PANEL
AG Ratio: 1.6 (calc) (ref 1.0–2.5)
ALT: 20 U/L (ref 6–29)
AST: 23 U/L (ref 10–35)
Albumin: 3.8 g/dL (ref 3.6–5.1)
Alkaline phosphatase (APISO): 96 U/L (ref 31–125)
BUN/Creatinine Ratio: 10 (calc) (ref 6–22)
BUN: 6 mg/dL — ABNORMAL LOW (ref 7–25)
CO2: 28 mmol/L (ref 20–32)
Calcium: 9.5 mg/dL (ref 8.6–10.2)
Chloride: 105 mmol/L (ref 98–110)
Creat: 0.62 mg/dL (ref 0.50–1.10)
Globulin: 2.4 g/dL (calc) (ref 1.9–3.7)
Glucose, Bld: 103 mg/dL — ABNORMAL HIGH (ref 65–99)
Potassium: 4.6 mmol/L (ref 3.5–5.3)
Sodium: 140 mmol/L (ref 135–146)
Total Bilirubin: 0.6 mg/dL (ref 0.2–1.2)
Total Protein: 6.2 g/dL (ref 6.1–8.1)

## 2020-07-13 LAB — CBC
HCT: 43.4 % (ref 35.0–45.0)
Hemoglobin: 14.3 g/dL (ref 11.7–15.5)
MCH: 29.9 pg (ref 27.0–33.0)
MCHC: 32.9 g/dL (ref 32.0–36.0)
MCV: 90.8 fL (ref 80.0–100.0)
MPV: 10.1 fL (ref 7.5–12.5)
Platelets: 266 10*3/uL (ref 140–400)
RBC: 4.78 10*6/uL (ref 3.80–5.10)
RDW: 12.1 % (ref 11.0–15.0)
WBC: 7.5 10*3/uL (ref 3.8–10.8)

## 2020-07-13 LAB — TSH: TSH: 2 mIU/L

## 2020-07-13 NOTE — Progress Notes (Signed)
Please inform patient of the following:  Labs are all STABLE. Would like for her to keep up the good work with diet and exercise and we can recheck in a year.  Nancy Mills. Jerline Pain, MD 07/13/2020 9:03 AM

## 2020-07-14 ENCOUNTER — Ambulatory Visit: Payer: 59 | Admitting: Obstetrics and Gynecology

## 2020-07-20 ENCOUNTER — Other Ambulatory Visit: Payer: 59

## 2020-07-21 ENCOUNTER — Ambulatory Visit: Payer: 59 | Admitting: Obstetrics and Gynecology

## 2020-07-21 ENCOUNTER — Encounter: Payer: Self-pay | Admitting: Obstetrics and Gynecology

## 2020-07-21 ENCOUNTER — Other Ambulatory Visit: Payer: Self-pay

## 2020-07-21 VITALS — BP 121/77 | HR 78 | Resp 16 | Ht 66.0 in | Wt 229.0 lb

## 2020-07-21 DIAGNOSIS — Z30431 Encounter for routine checking of intrauterine contraceptive device: Secondary | ICD-10-CM | POA: Diagnosis not present

## 2020-07-21 NOTE — Progress Notes (Signed)
   IUD Insertion Follow Up   Patient is 49 y.o. G2P2 who is here for string check/followup after insertion of Mirena IUD 4 weeks ago. She is doing well, had minor pain after procedure that is now resolved. She had minimal spotting after procedure. Denies pain or other issues.  BP 121/77   Pulse 78   Resp 16   Ht 5' 6"  (1.676 m)   Wt 229 lb (103.9 kg)   BMI 36.96 kg/m   Gen: alert, oriented Abd: soft, non-tender GU: normal appearing vaginal mucosa, normal appearing cervix with purple strings protruding 3 cm from cervix. Strings not trimmed.    Normal post IUD insertion check. Return 1 year for annual or prn    K. Arvilla Meres, M.D. [facility] [employer] [facility])

## 2020-07-25 ENCOUNTER — Other Ambulatory Visit: Payer: Self-pay

## 2020-07-25 DIAGNOSIS — M5416 Radiculopathy, lumbar region: Secondary | ICD-10-CM

## 2020-07-25 MED ORDER — TRAMADOL HCL 50 MG PO TABS
50.0000 mg | ORAL_TABLET | Freq: Three times a day (TID) | ORAL | 0 refills | Status: DC | PRN
Start: 2020-07-25 — End: 2020-09-12

## 2020-07-25 MED FILL — traMADol HCL 50 MG TABS: 50 | 4 days supply | Qty: 21 | Fill #0

## 2020-08-01 MED FILL — GABAPENTIN 600 MG TABLET: 600 | 30 days supply | Qty: 180 | Fill #1

## 2020-08-02 ENCOUNTER — Ambulatory Visit (INDEPENDENT_AMBULATORY_CARE_PROVIDER_SITE_OTHER): Payer: 59 | Admitting: Gastroenterology

## 2020-08-02 ENCOUNTER — Other Ambulatory Visit: Payer: Self-pay

## 2020-08-02 ENCOUNTER — Encounter: Payer: Self-pay | Admitting: Gastroenterology

## 2020-08-02 VITALS — BP 117/81 | HR 80 | Ht 66.0 in | Wt 229.4 lb

## 2020-08-02 DIAGNOSIS — K7460 Unspecified cirrhosis of liver: Secondary | ICD-10-CM

## 2020-08-02 DIAGNOSIS — K7581 Nonalcoholic steatohepatitis (NASH): Secondary | ICD-10-CM

## 2020-08-02 NOTE — Progress Notes (Signed)
Gastroenterology Consultation  Referring Provider:     Vivi Barrack, MD Primary Care Physician:  Vivi Barrack, MD Primary Gastroenterologist:  Dr. Allen Norris     Reason for Consultation:     Cirrhosis        HPI:   Nancy Mills is a 49 y.o. y/o female referred for consultation & management of Cirrhosis by Dr. Jerline Pain, Algis Greenhouse, MD.  This patient comes in today to transfer care to me after being followed by another gastroenterologist.  The patient had been diagnosed with cirrhosis and portal hypertension due to fatty liver.  The patient underwent gastric bypass surgery and lost a considerable amount of weight.  Since then the patient states that her liver enzymes have come down and her diabetes has resolved with her A1c time back to normal.  The patient reports that she was diagnosed with cirrhosis on a liver biopsy and she was following with her hepatologist with intermittent in ultrasounds and alpha-fetoprotein for [facility] surveillance.  The patient also states that she was getting intermittent fibrosis scans. She reports that her last EGD did not show any varices but she states she was due for another one.  The patient also states that she has not had a colonoscopy in the past.  She denies any episodes of GI bleeding with hematemesis black stools or bloody stools.  She also denies any abdominal distention suggestive of ascites. The patient expresses concern due to the fact that her father also had cirrhosis and died of liver cancer very quickly after being diagnosed.  Past Medical History:  Diagnosis Date  . Adjustment disorder with mixed anxiety and depressed mood   . Anxiety   . Bipolar 1 disorder ([place])   . Diabetes mellitus without complication ([place])   . HSV-2 infection   . Liver cirrhosis secondary to NASH ([place])   . Severe obesity ([place])   . Thyroid disease     Past Surgical History:  Procedure Laterality Date  . ABDOMINAL SURGERY    . abdomnal wall reconstruction    .  CHOLECYSTECTOMY    . RIGHT OOPHORECTOMY      Prior to Admission medications   Medication Sig Start Date End Date Taking? Authorizing Provider  albuterol (PROVENTIL HFA;VENTOLIN HFA) 108 (90 BASE) MCG/ACT inhaler Inhale 2 puffs into the lungs every 6 (six) hours as needed for wheezing.    Yes [provider]  ALPRAZolam Duanne Moron) 1 MG tablet Take 1 tablet (1 mg total) by mouth 2 (two) times daily as needed for anxiety. 07/12/20  Yes Vivi Barrack, MD  folic acid (FOLVITE) 287 MCG tablet Take 400 mcg by mouth daily.   Yes [provider]  gabapentin (NEURONTIN) 600 MG tablet Two tabs PO TID Patient taking differently: Take 600 mg by mouth 3 (three) times daily.  05/11/20  Yes Silverio Decamp, MD  hydrOXYzine (ATARAX/VISTARIL) 50 MG tablet Take 50 mg by mouth at bedtime as needed for anxiety.  12/09/19  Yes [provider]  lamoTRIgine (LAMICTAL) 200 MG tablet Take 1 tablet (200 mg total) by mouth 2 (two) times daily. 06/07/20  Yes Vivi Barrack, MD  levonorgestrel [facility]) 20 MCG/24HR IUD 1 each by Intrauterine route once.   Yes [provider]  Multiple Vitamins-Minerals (OPURITY BYPASS OPTIMIZED) CHEW Chew 1 tablet by mouth daily.    Yes [provider]  omeprazole (PRILOSEC) 20 MG capsule Take 1 capsule (20 mg total) by mouth daily. 06/13/20  Yes Dimas Chyle  M, MD  ondansetron (ZOFRAN) 4 MG tablet Take 1 tablet (4 mg total) by mouth every 6 (six) hours. Patient taking differently: Take 4 mg by mouth every 8 (eight) hours as needed for nausea.  10/24/12  Yes Blanchie Dessert, MD  Probiotic Product (PROBIOTIC-10 PO) Take 1 capsule by mouth daily.    Yes [provider]  senna-docusate (SENOKOT-S) 8.6-50 MG tablet Take 1 tablet by mouth daily.  06/04/18  Yes [provider]  SYNTHROID 200 MCG tablet Take 200 mcg by mouth daily. 05/06/20  Yes [provider]  traMADol (ULTRAM) 50 MG tablet Take 1-2 tablets (50-100 mg total) by  mouth every 8 (eight) hours as needed for moderate pain. Maximum 6 tabs per day. 07/25/20  Yes Silverio Decamp, MD  traZODone (DESYREL) 50 MG tablet Take 50 mg by mouth at bedtime as needed for sleep.  12/09/19  Yes [provider]  vitamin B-12 (CYANOCOBALAMIN) 100 MCG tablet Take 100 mcg by mouth daily.   Yes [provider]    History reviewed. No pertinent family history.   Social History   Tobacco Use  . Smoking status: Current Every Day Smoker    Packs/day: 0.50    Years: 20.00    Pack years: 10.00  . Smokeless tobacco: Never Used  Substance Use Topics  . Alcohol use: No  . Drug use: No    Allergies as of 08/02/2020 - Review Complete 08/02/2020  Allergen Reaction Noted  . Other Anaphylaxis 07/12/2020  . Clindamycin/lincomycin Diarrhea 01/01/2017  . Latex Rash 01/01/2017  . Nsaids  10/13/2019  . Erythromycin  10/24/2012  . Penicillins  10/24/2012  . Chicken allergy Other (See Comments) 06/10/2019  . Codeine  10/24/2012  . Morphine and related Nausea And Vomiting 02/07/2020    Review of Systems:    All systems reviewed and negative except where noted in HPI.   Physical Exam:  BP 117/81   Pulse 80   Ht 5' 6"  (1.676 m)   Wt 229 lb 6.4 oz (104.1 kg)   BMI 37.03 kg/m  No LMP recorded. (Menstrual status: IUD). General:   Alert,  Well-developed, well-nourished, pleasant and cooperative in NAD Head:  Normocephalic and atraumatic. Eyes:  Sclera clear, no icterus.   Conjunctiva pink. Ears:  Normal auditory acuity. Neck:  Supple; no masses or thyromegaly. Lungs:  Respirations even and unlabored.  Clear throughout to auscultation.   No wheezes, crackles, or rhonchi. No acute distress. Heart:  Regular rate and rhythm; no murmurs, clicks, rubs, or gallops. Abdomen:  Normal bowel sounds.  No bruits.  Soft, non-tender and non-distended without masses, hepatosplenomegaly or hernias noted.  No guarding or rebound tenderness.  Negative Carnett sign.    Rectal:  Deferred.  Pulses:  Normal pulses noted. Extremities:  No clubbing or edema.  No cyanosis. Neurologic:  Alert and oriented x3;  grossly normal neurologically. Skin:  Intact without significant lesions or rashes.  No jaundice. Lymph Nodes:  No significant cervical adenopathy. Psych:  Alert and cooperative. Normal mood and affect.  Imaging Studies: No results found.  Assessment and Plan:   MICHAL STRZELECKI is a 49 y.o. y/o female who comes in today for transfer of care due to cirrhosis from fatty liver. The patient has gotten her weight under control from gastric bypass surgery and states that she is now doing well from a hemoglobin A1c point of view and her most recent liver enzymes were normal as reported by her. The patient will be set  up for an EGD to assess for varices and will also be set up for screening colonoscopy.  The patient will have her blood sends off for alpha-fetoprotein and she will have a ultrasound of the right upper quadrant.      Lucilla Lame, MD. Marval Regal    Note: This dictation was prepared with Dragon dictation along with smaller phrase technology. Any transcriptional errors that result from this process are unintentional.

## 2020-08-03 ENCOUNTER — Telehealth: Payer: Self-pay

## 2020-08-03 LAB — AFP TUMOR MARKER: AFP, Serum, Tumor Marker: 3.4 ng/mL (ref 0.0–8.3)

## 2020-08-03 NOTE — Telephone Encounter (Signed)
Pt notified of results via mychart.

## 2020-08-03 NOTE — Telephone Encounter (Signed)
-----   Message from Lucilla Lame, MD sent at 08/03/2020  8:24 AM EDT ----- Let the patient know that the alpha-fetoprotein was normal.

## 2020-08-05 ENCOUNTER — Ambulatory Visit: Payer: 59 | Admitting: Sports Medicine

## 2020-08-08 ENCOUNTER — Encounter: Payer: Self-pay | Admitting: Nurse Practitioner

## 2020-08-08 ENCOUNTER — Ambulatory Visit (INDEPENDENT_AMBULATORY_CARE_PROVIDER_SITE_OTHER): Payer: 59 | Admitting: Nurse Practitioner

## 2020-08-08 VITALS — BP 133/79 | HR 53 | Ht 66.0 in | Wt 227.0 lb

## 2020-08-08 DIAGNOSIS — R42 Dizziness and giddiness: Secondary | ICD-10-CM | POA: Diagnosis not present

## 2020-08-08 NOTE — Assessment & Plan Note (Signed)
Sudden onset of nausea and dizziness without known cause. Vital signs stable throughout episode, with no signs of tachyarrhythmia, bradycardia, or hypotension.  Zofran provided for nausea. Recommend monitoring for similar symptoms, increase fluid intake, avoid swift movements, rise from seated or laying position slowly.  If symptoms persist, recommend laboratory examination for hemoglobin and electrolytes.  Follow-up if symptoms return.

## 2020-08-08 NOTE — Progress Notes (Signed)
Acute Office Visit  Subjective:    Patient ID: Nancy Mills, female    DOB: February 26, 1971, 49 y.o.   MRN: [id number]  Chief Complaint  Patient presents with  . Nausea    HPI Patient is in today for sudden onset of nausea, dizziness, and dry heaves while at work today. She reports while working she suddenly began to feel ill, got very dizzy, broke out into a cold sweat, became extremely nauseated, shaking, and felt "like I am going to pass out".   She was taken to a quiet location and laid down and vitals were taken which showed heart rate in the 70's with regular rhythm, pulse ox upper 90's, and BP 150's/80's. She was provided with an ice pack, water, and Zofran 58m ODT.   After approximately 5 minutes, repeat vitals revealed BP 133/72 with other vitals unchanged.  She reports that after approximately 10-15 minutes her symptoms began to improve. She reports that her head "felt a little off" but her symptoms resolved enough that she could return to her normal work schedule.   She reports that she had eaten that morning and denies being dehydrated. She denies numbness or weakness on one side, loss of consciousness, or incontinence. She reports that she has felt this way once before when she had to be hospitalized for suspected seizures.   Past Medical History:  Diagnosis Date  . Adjustment disorder with mixed anxiety and depressed mood   . Anxiety   . Bipolar 1 disorder ([facility]   . Diabetes mellitus without complication ([place]   . HSV-2 infection   . Liver cirrhosis secondary to NASH ([facility]   . Severe obesity ([facility]   . Thyroid disease     Past Surgical History:  Procedure Laterality Date  . ABDOMINAL SURGERY    . abdomnal wall reconstruction    . CHOLECYSTECTOMY    . RIGHT OOPHORECTOMY      No family history on file.  Social History   Socioeconomic History  . Marital status: Legally Separated    Spouse name: Not on file  . Number of children: Not on file  . Years of  education: Not on file  . Highest education level: Not on file  Occupational History  . Not on file  Tobacco Use  . Smoking status: Current Every Day Smoker    Packs/day: 0.50    Years: 20.00    Pack years: 10.00  . Smokeless tobacco: Never Used  Substance and Sexual Activity  . Alcohol use: No  . Drug use: No  . Sexual activity: Yes    Birth control/protection: I.U.D.  Other Topics Concern  . Not on file  Social History Narrative  . Not on file   Social Determinants of Health   Financial Resource Strain:   . Difficulty of Paying Living Expenses: Not on file  Food Insecurity:   . Worried About RCharity fundraiserin the Last Year: Not on file  . Ran Out of Food in the Last Year: Not on file  Transportation Needs:   . Lack of Transportation (Medical): Not on file  . Lack of Transportation (Non-Medical): Not on file  Physical Activity:   . Days of Exercise per Week: Not on file  . Minutes of Exercise per Session: Not on file  Stress:   . Feeling of Stress : Not on file  Social Connections:   . Frequency of Communication with Friends and Family: Not on file  . Frequency of Social Gatherings  with Friends and Family: Not on file  . Attends Religious Services: Not on file  . Active Member of Clubs or Organizations: Not on file  . Attends Archivist Meetings: Not on file  . Marital Status: Not on file  Intimate Partner Violence:   . Fear of Current or Ex-Partner: Not on file  . Emotionally Abused: Not on file  . Physically Abused: Not on file  . Sexually Abused: Not on file    Outpatient Medications Prior to Visit  Medication Sig Dispense Refill  . albuterol (PROVENTIL HFA;VENTOLIN HFA) 108 (90 BASE) MCG/ACT inhaler Inhale 2 puffs into the lungs every 6 (six) hours as needed for wheezing.     Marland Kitchen ALPRAZolam (XANAX) 1 MG tablet Take 1 tablet (1 mg total) by mouth 2 (two) times daily as needed for anxiety. 951 tablet 1  . folic acid (FOLVITE) 884 MCG tablet Take  400 mcg by mouth daily.    Marland Kitchen gabapentin (NEURONTIN) 600 MG tablet Two tabs PO TID (Patient taking differently: Take 600 mg by mouth 3 (three) times daily. ) 180 tablet 3  . hydrOXYzine (ATARAX/VISTARIL) 50 MG tablet Take 50 mg by mouth at bedtime as needed for anxiety.     . lamoTRIgine (LAMICTAL) 200 MG tablet Take 1 tablet (200 mg total) by mouth 2 (two) times daily. 180 tablet 1  . levonorgestrel (MIRENA) 20 MCG/24HR IUD 1 each by Intrauterine route once.    . Multiple Vitamins-Minerals (OPURITY BYPASS OPTIMIZED) CHEW Chew 1 tablet by mouth daily.     Marland Kitchen omeprazole (PRILOSEC) 20 MG capsule Take 1 capsule (20 mg total) by mouth daily. 90 capsule 1  . ondansetron (ZOFRAN) 4 MG tablet Take 1 tablet (4 mg total) by mouth every 6 (six) hours. (Patient taking differently: Take 4 mg by mouth every 8 (eight) hours as needed for nausea. ) 12 tablet 0  . Probiotic Product (PROBIOTIC-10 PO) Take 1 capsule by mouth daily.     Marland Kitchen senna-docusate (SENOKOT-S) 8.6-50 MG tablet Take 1 tablet by mouth daily.     Marland Kitchen SYNTHROID 200 MCG tablet Take 200 mcg by mouth daily.    . traMADol (ULTRAM) 50 MG tablet Take 1-2 tablets (50-100 mg total) by mouth every 8 (eight) hours as needed for moderate pain. Maximum 6 tabs per day. 21 tablet 0  . traZODone (DESYREL) 50 MG tablet Take 50 mg by mouth at bedtime as needed for sleep.     . vitamin B-12 (CYANOCOBALAMIN) 100 MCG tablet Take 100 mcg by mouth daily.     No facility-administered medications prior to visit.    Allergies  Allergen Reactions  . Other Anaphylaxis    Covi-19 Vaccine (Pfiser)  . Clindamycin/Lincomycin Diarrhea    Severe diarrhea, cannot tolerate  . Latex Rash  . Nsaids     Due to history of gastric bypass  . Erythromycin   . Penicillins   . Chicken Allergy Other (See Comments)    Gastric bypass surgery, can not digest   . Codeine   . Morphine And Related Nausea And Vomiting    Review of Systems See HPI    Objective:    Physical  Exam Vitals reviewed.  Constitutional:      Appearance: She is ill-appearing.  HENT:     Head: Normocephalic.  Eyes:     Extraocular Movements: Extraocular movements intact.     Conjunctiva/sclera: Conjunctivae normal.     Pupils: Pupils are equal, round, and reactive to light.  Cardiovascular:  Rate and Rhythm: Normal rate and regular rhythm.     Pulses: Normal pulses.     Heart sounds: Normal heart sounds.  Pulmonary:     Effort: Pulmonary effort is normal.     Breath sounds: Normal breath sounds.  Abdominal:     General: Abdomen is flat.     Palpations: Abdomen is soft.  Musculoskeletal:        General: Normal range of motion.  Skin:    Capillary Refill: Capillary refill takes less than 2 seconds.     Coloration: Skin is pale.  Neurological:     General: No focal deficit present.     Mental Status: She is alert and oriented to person, place, and time.     Coordination: Coordination normal.     Gait: Gait normal.  Psychiatric:        Mood and Affect: Mood normal.        Behavior: Behavior normal.        Thought Content: Thought content normal.        Judgment: Judgment normal.    All symptoms resolved within 15 minutes  BP 133/79   Pulse (!) 53   Ht 5' 6"  (1.676 m)   Wt 227 lb (103 kg)   SpO2 97%   BMI 36.64 kg/m  Wt Readings from Last 3 Encounters:  08/08/20 227 lb (103 kg)  08/02/20 229 lb 6.4 oz (104.1 kg)  07/21/20 229 lb (103.9 kg)    There are no preventive care reminders to display for this patient.  There are no preventive care reminders to display for this patient.   Lab Results  Component Value Date   TSH 2.00 07/12/2020   Lab Results  Component Value Date   WBC 7.5 07/12/2020   HGB 14.3 07/12/2020   HCT 43.4 07/12/2020   MCV 90.8 07/12/2020   PLT 266 07/12/2020   Lab Results  Component Value Date   NA 140 07/12/2020   K 4.6 07/12/2020   CO2 28 07/12/2020   GLUCOSE 103 (H) 07/12/2020   BUN 6 (L) 07/12/2020   CREATININE 0.62  07/12/2020   BILITOT 0.6 07/12/2020   ALKPHOS 72 05/17/2020   AST 23 07/12/2020   ALT 20 07/12/2020   PROT 6.2 07/12/2020   ALBUMIN 3.1 (L) 05/17/2020   CALCIUM 9.5 07/12/2020   ANIONGAP 7 05/17/2020   Lab Results  Component Value Date   CHOL 137 07/12/2020   Lab Results  Component Value Date   HDL 45 (L) 07/12/2020   Lab Results  Component Value Date   LDLCALC 75 07/12/2020   Lab Results  Component Value Date   TRIG 85 07/12/2020   Lab Results  Component Value Date   CHOLHDL 3.0 07/12/2020   Lab Results  Component Value Date   HGBA1C 5.3 07/12/2020       Assessment & Plan:   Problem List Items Addressed This Visit      Other   Episode of dizziness - Primary    Sudden onset of nausea and dizziness without known cause. Vital signs stable throughout episode, with no signs of tachyarrhythmia, bradycardia, or hypotension.  Zofran provided for nausea. Recommend monitoring for similar symptoms, increase fluid intake, avoid swift movements, rise from seated or laying position slowly.  If symptoms persist, recommend laboratory examination for hemoglobin and electrolytes.  Follow-up if symptoms return.           No orders of the defined types were placed in this encounter.  Orma Render, NP

## 2020-08-08 NOTE — Progress Notes (Signed)
Patient had an episode of severe nausea with dry heaving.

## 2020-08-10 ENCOUNTER — Ambulatory Visit
Admission: RE | Admit: 2020-08-10 | Discharge: 2020-08-10 | Disposition: A | Discharge status: Home / Self Care | Payer: 59 | Source: Ambulatory Visit | Attending: Obstetrics and Gynecology | Admitting: Obstetrics and Gynecology

## 2020-08-10 ENCOUNTER — Other Ambulatory Visit: Payer: Self-pay | Admitting: Obstetrics and Gynecology

## 2020-08-10 ENCOUNTER — Other Ambulatory Visit: Payer: Self-pay

## 2020-08-10 ENCOUNTER — Ambulatory Visit: Admission: RE | Admit: 2020-08-10 | Payer: 59 | Source: Ambulatory Visit

## 2020-08-10 ENCOUNTER — Ambulatory Visit (INDEPENDENT_AMBULATORY_CARE_PROVIDER_SITE_OTHER): Payer: 59

## 2020-08-10 ENCOUNTER — Encounter: Payer: Self-pay | Admitting: *Deleted

## 2020-08-10 DIAGNOSIS — K7460 Unspecified cirrhosis of liver: Secondary | ICD-10-CM

## 2020-08-10 DIAGNOSIS — K7581 Nonalcoholic steatohepatitis (NASH): Secondary | ICD-10-CM

## 2020-08-10 DIAGNOSIS — N6489 Other specified disorders of breast: Secondary | ICD-10-CM

## 2020-08-10 DIAGNOSIS — R928 Other abnormal and inconclusive findings on diagnostic imaging of breast: Secondary | ICD-10-CM | POA: Diagnosis not present

## 2020-08-10 DIAGNOSIS — R160 Hepatomegaly, not elsewhere classified: Secondary | ICD-10-CM | POA: Diagnosis not present

## 2020-08-11 ENCOUNTER — Telehealth: Payer: Self-pay

## 2020-08-11 ENCOUNTER — Encounter: Payer: Self-pay | Admitting: Family Medicine

## 2020-08-11 NOTE — Telephone Encounter (Signed)
Pt notified of [place] results via mychart.

## 2020-08-11 NOTE — Telephone Encounter (Signed)
-----   Message from Lucilla Lame, MD sent at 08/11/2020  7:54 AM EDT ----- Let the patient know that the U/S showed some fatty liver but no other abnormalities. No signs of portal hypertension. No masses.

## 2020-08-12 ENCOUNTER — Other Ambulatory Visit: Payer: Self-pay

## 2020-08-12 ENCOUNTER — Ambulatory Visit: Payer: 59 | Admitting: Sports Medicine

## 2020-08-12 MED ORDER — LEVOTHYROXINE SODIUM 200 MCG PO TABS
200.0000 ug | ORAL_TABLET | Freq: Every day | ORAL | 3 refills | Status: DC
Start: 2020-08-12 — End: 2020-08-12

## 2020-08-12 MED ORDER — LEVOTHYROXINE SODIUM 200 MCG PO TABS: 200.0000 ug | ORAL_TABLET | Freq: Every day | ORAL | 3 refills | Status: DC

## 2020-08-12 MED FILL — LEVOTHYROXINE SODIUM 200 MC: 200 | 90 days supply | Qty: 90 | Fill #0

## 2020-08-17 ENCOUNTER — Ambulatory Visit (INDEPENDENT_AMBULATORY_CARE_PROVIDER_SITE_OTHER): Payer: 59 | Admitting: Sports Medicine

## 2020-08-17 ENCOUNTER — Ambulatory Visit: Payer: 59 | Admitting: Sports Medicine

## 2020-08-17 DIAGNOSIS — M79674 Pain in right toe(s): Secondary | ICD-10-CM

## 2020-08-17 NOTE — Progress Notes (Signed)
    Procedures performed today:    None.  Independent interpretation of notes and tests performed by another provider:   None.  Brief History, Exam, Impression, and Recommendations:    Pain of right great toe This is a pleasant 49 year old female, she is had pain at her right great toe for some time now, there is a new lump over the dorsal/medial aspect of the IP joint. It feels like a ganglion cyst clinically. Because of minimal intermittent discomfort I added a first metatarsal ray post, we are also can get some x-rays. Avoiding NSAIDs due to gastric surgery. If insufficient improvement and if pain is severe enough we will consider injection. We will follow this up in a few weeks.    ___________________________________________ Gwen Her. Dianah Field, M.D., ABFM., CAQSM. Primary Care and [place] Instructor of [place] [facility]

## 2020-08-17 NOTE — Assessment & Plan Note (Signed)
This is a pleasant 49 year old female, she is had pain at her right great toe for some time now, there is a new lump over the dorsal/medial aspect of the IP joint. It feels like a ganglion cyst clinically. Because of minimal intermittent discomfort I added a first metatarsal ray post, we are also can get some x-rays. Avoiding NSAIDs due to gastric surgery. If insufficient improvement and if pain is severe enough we will consider injection. We will follow this up in a few weeks.

## 2020-08-18 MED FILL — LEVOTHYROXINE SODIUM 200 MC: 200 | 90 days supply | Qty: 90 | Fill #0

## 2020-08-24 ENCOUNTER — Other Ambulatory Visit: Payer: Self-pay | Admitting: Obstetrics and Gynecology

## 2020-08-24 ENCOUNTER — Ambulatory Visit
Admission: RE | Admit: 2020-08-24 | Discharge: 2020-08-24 | Disposition: A | Discharge status: Home / Self Care | Payer: 59 | Source: Ambulatory Visit | Attending: Obstetrics and Gynecology | Admitting: Obstetrics and Gynecology

## 2020-08-24 ENCOUNTER — Other Ambulatory Visit: Payer: Self-pay

## 2020-08-24 DIAGNOSIS — N6489 Other specified disorders of breast: Secondary | ICD-10-CM

## 2020-08-31 ENCOUNTER — Other Ambulatory Visit: Payer: Self-pay

## 2020-08-31 ENCOUNTER — Ambulatory Visit (INDEPENDENT_AMBULATORY_CARE_PROVIDER_SITE_OTHER): Payer: 59 | Admitting: Sports Medicine

## 2020-08-31 ENCOUNTER — Ambulatory Visit (INDEPENDENT_AMBULATORY_CARE_PROVIDER_SITE_OTHER): Payer: 59

## 2020-08-31 DIAGNOSIS — M79674 Pain in right toe(s): Secondary | ICD-10-CM

## 2020-08-31 DIAGNOSIS — R2241 Localized swelling, mass and lump, right lower limb: Secondary | ICD-10-CM | POA: Diagnosis not present

## 2020-08-31 DIAGNOSIS — M19071 Primary osteoarthritis, right ankle and foot: Secondary | ICD-10-CM | POA: Diagnosis not present

## 2020-08-31 NOTE — Assessment & Plan Note (Signed)
This is a pleasant 49 year old female, right great toe pain for some time now with a lump over the dorsal medial aspect of the interphalangeal joint. Clinically we suspected a ganglion cyst, we had a first metatarsal ray post, and we were avoiding NSAIDs due to gastric surgery. At this point 2 weeks have passed and she continues to have pain so we injected her IP joint. She will get x-rays today, return to see me in a month.

## 2020-08-31 NOTE — Progress Notes (Signed)
    Procedures performed today:    Procedure: Real-time Ultrasound Guided injection of the right first IP joint Device: Samsung HS60  Verbal informed consent obtained.  Time-out conducted.  Noted no overlying erythema, induration, or other signs of local infection.  Skin prepped in a sterile fashion.  Local anesthesia: Topical Ethyl chloride.  With sterile technique and under real time ultrasound guidance: Noted arthritic IP joint, 1/2 cc lidocaine, 1/2 cc kenalog 40 injected easily with a 25-gauge needle.  Completed without difficulty  Pain immediately resolved suggesting accurate placement of the medication.  Advised to call if fevers/chills, erythema, induration, drainage, or persistent bleeding.  Images permanently stored and available for review in PACS.  Impression: Technically successful ultrasound guided injection.  Independent interpretation of notes and tests performed by another provider:   None.  Brief History, Exam, Impression, and Recommendations:    Pain of right great toe This is a pleasant 49 year old female, right great toe pain for some time now with a lump over the dorsal medial aspect of the interphalangeal joint. Clinically we suspected a ganglion cyst, we had a first metatarsal ray post, and we were avoiding NSAIDs due to gastric surgery. At this point 2 weeks have passed and she continues to have pain so we injected her IP joint. She will get x-rays today, return to see me in a month.    ___________________________________________ Gwen Her. Dianah Field, M.D., ABFM., CAQSM. Primary Care and [place] Instructor of [place] [facility] of Medicine

## 2020-09-11 ENCOUNTER — Other Ambulatory Visit: Payer: Self-pay

## 2020-09-11 DIAGNOSIS — M5416 Radiculopathy, lumbar region: Secondary | ICD-10-CM

## 2020-09-12 ENCOUNTER — Ambulatory Visit (INDEPENDENT_AMBULATORY_CARE_PROVIDER_SITE_OTHER): Payer: 59 | Admitting: Medical-Surgical

## 2020-09-12 ENCOUNTER — Encounter: Payer: Self-pay | Admitting: Medical-Surgical

## 2020-09-12 ENCOUNTER — Other Ambulatory Visit: Payer: Self-pay

## 2020-09-12 ENCOUNTER — Other Ambulatory Visit: Payer: Self-pay | Admitting: Medical-Surgical

## 2020-09-12 ENCOUNTER — Other Ambulatory Visit: Payer: Self-pay | Admitting: Sports Medicine

## 2020-09-12 VITALS — BP 128/80 | HR 82 | Wt 232.0 lb

## 2020-09-12 DIAGNOSIS — K047 Periapical abscess without sinus: Secondary | ICD-10-CM

## 2020-09-12 DIAGNOSIS — M5416 Radiculopathy, lumbar region: Secondary | ICD-10-CM

## 2020-09-12 MED ORDER — TRAMADOL HCL 50 MG PO TABS: 50.0000 mg | ORAL_TABLET | Freq: Two times a day (BID) | ORAL | 2 refills | Status: DC | PRN

## 2020-09-12 MED ORDER — FLUCONAZOLE 150 MG PO TABS: 150.0000 mg | ORAL_TABLET | Freq: Once | ORAL | 0 refills | Status: DC

## 2020-09-12 MED ORDER — CEFDINIR 300 MG PO CAPS: 300.0000 mg | ORAL_CAPSULE | Freq: Two times a day (BID) | ORAL | 0 refills | Status: DC

## 2020-09-12 MED FILL — FLUCONAZOLE 150 MG TABS: 150 | 1 days supply | Qty: 1 | Fill #0

## 2020-09-12 MED FILL — traMADol HCL 50 MG TABS: 50 | 15 days supply | Qty: 60 | Fill #0

## 2020-09-12 MED FILL — CEFDINIR 300 MG CAPSULE: 300 | 7 days supply | Qty: 14 | Fill #0

## 2020-09-12 NOTE — Progress Notes (Signed)
Subjective:    CC: tooth pain  HPI: Pleasant 49 year old female presenting today with reports of a broken tooth that occurred on Friday evening. She was eating a pickle and had one of her left upper jaw molars break. She was able to spit pieces out and noted the old silver filling that had been in that tooth. She did not have any pain at that time. On Saturday, she went to a wedding that was outside and it was somewhat cold. On Sunday, she noted some drainage on the left side of her throat/mouth. She noted developing tightness to the left side of her face from just under her eye and across her cheek. She also noted some redness from the left side of the nose, spreading down the cheek. Her left cheek and upper jaw was warm/hot to touch. She has had several episodes of dental abscesses requiring antibiotics this year. Has plans to have extensive dental work done after the first of the year. Denies fever, chills.   I reviewed the past medical history, family history, social history, surgical history, and allergies today and no changes were needed.  Please see the problem list section below in epic for further details.  Past Medical History: Past Medical History:  Diagnosis Date  . Adjustment disorder with mixed anxiety and depressed mood   . Anxiety   . Asthma   . Bipolar 1 disorder ([place])   . Depression   . Diabetes mellitus without complication ([place])   . HSV-2 infection   . Liver cirrhosis secondary to NASH ([place])   . Migraines   . Severe obesity ([place])   . Thyroid disease    Past Surgical History: Past Surgical History:  Procedure Laterality Date  . ABDOMINAL SURGERY    . abdomnal wall reconstruction    . CESAREAN SECTION  2001 2004  . CHOLECYSTECTOMY    . RIGHT OOPHORECTOMY     Social History: Social History   Socioeconomic History  . Marital status: Legally Separated    Spouse name: Not on file  . Number of children: Not on file  . Years of education: Not on file  . Highest  education level: Not on file  Occupational History  . Not on file  Tobacco Use  . Smoking status: Current Every Day Smoker    Packs/day: 0.50    Years: 20.00    Pack years: 10.00  . Smokeless tobacco: Never Used  Substance and Sexual Activity  . Alcohol use: Yes  . Drug use: No  . Sexual activity: Yes    Partners: Male    Birth control/protection: I.U.D.  Other Topics Concern  . Not on file  Social History Narrative  . Not on file   Social Determinants of Health   Financial Resource Strain:   . Difficulty of Paying Living Expenses: Not on file  Food Insecurity:   . Worried About Charity fundraiser in the Last Year: Not on file  . Ran Out of Food in the Last Year: Not on file  Transportation Needs:   . Lack of Transportation (Medical): Not on file  . Lack of Transportation (Non-Medical): Not on file  Physical Activity:   . Days of Exercise per Week: Not on file  . Minutes of Exercise per Session: Not on file  Stress:   . Feeling of Stress : Not on file  Social Connections:   . Frequency of Communication with Friends and Family: Not on file  . Frequency of Social Gatherings with  Friends and Family: Not on file  . Attends Religious Services: Not on file  . Active Member of Clubs or Organizations: Not on file  . Attends Archivist Meetings: Not on file  . Marital Status: Not on file   Family History: Family History  Problem Relation Age of Onset  . Depression Mother   . Mental illness Mother   . Cancer Father   . Depression Father   . Diabetes Father   . Depression Brother   . Heart attack Maternal Grandfather   . Heart disease Maternal Grandfather   . High blood pressure Maternal Grandfather   . Stroke Maternal Grandfather   . COPD Paternal Grandmother   . COPD Paternal Grandfather   . Heart attack Paternal Grandfather   . High blood pressure Paternal Grandfather    Allergies: Allergies  Allergen Reactions  . Other Anaphylaxis    Covi-19  Vaccine (Pfiser)  . Clindamycin/Lincomycin Diarrhea    Severe diarrhea, cannot tolerate  . Latex Rash  . Nsaids     Due to history of gastric bypass  . Erythromycin   . Penicillins   . Chicken Allergy Other (See Comments)    Gastric bypass surgery, can not digest   . Codeine   . Morphine And Related Nausea And Vomiting   Medications: See med rec.  Review of Systems: See HPI for pertinent positives and negatives.   Objective:    General: Well Developed, well nourished, and in no acute distress.  Neuro: Alert and oriented x3.  HEENT: Normocephalic, atraumatic.  Skin: Warm and dry. Cardiac: Regular rate and rhythm.  Respiratory:  Not using accessory muscles, speaking in full sentences.  Impression and Recommendations:    1. Dental infection Sending in Cefcinir 342m BID x 7 days. Sending Diflucan for vulvovaginal candidiasis prophylaxis as she reports frequent yeast infections with antibiotics. Strongly encouraged dental care as soon as possible.  - cefdinir (OMNICEF) 300 MG capsule; Take 1 capsule (300 mg total) by mouth 2 (two) times daily.  Dispense: 14 capsule; Refill: 0  Return if symptoms worsen or fail to improve. ___________________________________________ JClearnce Sorrel DNP, APRN, FNP-BC Primary Care and [place]

## 2020-09-25 ENCOUNTER — Encounter (INDEPENDENT_AMBULATORY_CARE_PROVIDER_SITE_OTHER): Payer: Self-pay

## 2020-10-13 ENCOUNTER — Other Ambulatory Visit: Payer: Self-pay | Admitting: Family Medicine

## 2020-10-13 MED FILL — GABAPENTIN 600 MG TABLET: 600 | 30 days supply | Qty: 180 | Fill #2

## 2020-10-13 MED FILL — ALPRAZolam 1 MG TABS: 1 | 90 days supply | Qty: 180 | Fill #1

## 2020-10-13 MED FILL — traMADol HCL 50 MG TABS: 50 | 15 days supply | Qty: 60 | Fill #1

## 2020-10-14 MED FILL — VALACYCLOVIR HCL 500 MG TAB: 500 | 90 days supply | Qty: 180 | Fill #0

## 2020-10-31 MED FILL — hydrOXYzine HCL 50 MG TABS: 50 | 30 days supply | Qty: 30 | Fill #1

## 2020-11-03 ENCOUNTER — Ambulatory Visit (INDEPENDENT_AMBULATORY_CARE_PROVIDER_SITE_OTHER): Payer: 59

## 2020-11-03 ENCOUNTER — Other Ambulatory Visit: Payer: Self-pay

## 2020-11-03 ENCOUNTER — Telehealth: Payer: Self-pay

## 2020-11-03 ENCOUNTER — Ambulatory Visit (INDEPENDENT_AMBULATORY_CARE_PROVIDER_SITE_OTHER): Payer: 59 | Admitting: Sports Medicine

## 2020-11-03 DIAGNOSIS — K089 Disorder of teeth and supporting structures, unspecified: Secondary | ICD-10-CM

## 2020-11-03 DIAGNOSIS — K047 Periapical abscess without sinus: Secondary | ICD-10-CM

## 2020-11-03 DIAGNOSIS — J341 Cyst and mucocele of nose and nasal sinus: Secondary | ICD-10-CM | POA: Diagnosis not present

## 2020-11-03 DIAGNOSIS — K029 Dental caries, unspecified: Secondary | ICD-10-CM | POA: Diagnosis not present

## 2020-11-03 DIAGNOSIS — Z8739 Personal history of other diseases of the musculoskeletal system and connective tissue: Secondary | ICD-10-CM | POA: Diagnosis not present

## 2020-11-03 MED ORDER — METRONIDAZOLE 500 MG PO TABS: 500.0000 mg | ORAL_TABLET | Freq: Two times a day (BID) | ORAL | 0 refills | Status: DC

## 2020-11-03 MED ORDER — CEFIXIME 200 MG/5ML PO SUSR: 400.0000 mg | Freq: Every day | ORAL | 0 refills | Status: AC

## 2020-11-03 MED ORDER — HYDROCODONE-ACETAMINOPHEN 10-325 MG PO TABS: 1.0000 | ORAL_TABLET | Freq: Three times a day (TID) | ORAL | 0 refills | Status: DC | PRN

## 2020-11-03 MED ORDER — PREDNISONE 50 MG PO TABS: ORAL_TABLET | ORAL | 0 refills | Status: DC

## 2020-11-03 MED ORDER — CEFIXIME 400 MG PO CAPS: 400.0000 mg | ORAL_CAPSULE | Freq: Every day | ORAL | 0 refills | Status: DC

## 2020-11-03 MED ORDER — IOHEXOL 300 MG/ML  SOLN
75.0000 mL | Freq: Once | INTRAMUSCULAR | Status: AC | PRN
  Administered 2020-11-03: 75 mL via INTRAVENOUS

## 2020-11-03 MED FILL — predniSONE 50 MG TABS: 50 | 5 days supply | Qty: 5 | Fill #0

## 2020-11-03 MED FILL — HYDROCODON-APAP 10-325: 10-325 | 5 days supply | Qty: 15 | Fill #0

## 2020-11-03 MED FILL — METRONIDAZOLE 500 MG TABS: 500 | 7 days supply | Qty: 14 | Fill #0

## 2020-11-03 MED FILL — CEFIXIME 200 MG/5ML SUSR: 200 | 7 days supply | Qty: 75 | Fill #0

## 2020-11-03 NOTE — Assessment & Plan Note (Addendum)
This is a 49 year old female, she has very poor dentition, multiple periapical abscesses, most recent on the left side, maxillary. Now having increasing pain, swelling in the right cheek, as well as the right maxillary molars. She does have an appointment coming up with a dentist, I have advised her that we will treat this today but further questions regarding her teeth need to go to either her PCP or a dentist/OMFS. Adding a stat CT with contrast of her maxillofacial bones looking for periapical abscess in the right maxillary molars or canines, also adding an oral third-generation cephalosporin (Omnicef has been used in the past recently without improvement so we will switch to Suprax) and metronidazole to cover HACEK organisms and anaerobes. Adding prednisone as well. Fluoroquinolones are also effective against odontogenic infection organisms, however macrolides and tetracyclines should be avoided.

## 2020-11-03 NOTE — Telephone Encounter (Signed)
New prescription sent in for the suspension at the same dose of 452m per day for 7 days.

## 2020-11-03 NOTE — Addendum Note (Signed)
Addended by: Silverio Decamp on: 11/03/2020 10:50 AM   Modules accepted: Orders

## 2020-11-03 NOTE — Progress Notes (Addendum)
    Procedures performed today:    None.  Independent interpretation of notes and tests performed by another provider:   I personally reviewed the CT scan, obvious reason for right-sided facial pain, I did discuss prominence of her right sigmoid venous sinus with radiology, it was determined that this is normal.  No change in plan, we will still do the antibiotics, steroids, and medication.  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  Brief History, Exam, Impression, and Recommendations:    Periapical abscess This is a 49 year old female, she has very poor dentition, multiple periapical abscesses, most recent on the left side, maxillary. Now having increasing pain, swelling in the right cheek, as well as the right maxillary molars. She does have an appointment coming up with a dentist, I have advised her that we will treat this today but further questions regarding her teeth need to go to either her PCP or a dentist/OMFS. Adding a stat CT with contrast of her maxillofacial bones looking for periapical abscess in the right maxillary molars or canines, also adding an oral third-generation cephalosporin (Omnicef has been used in the past recently without improvement so we will switch to Suprax) and metronidazole to cover HACEK organisms and anaerobes. Adding prednisone as well. Fluoroquinolones are also effective against odontogenic infection organisms, however macrolides and tetracyclines should be avoided.    ___________________________________________ Gwen Her. Dianah Field, M.D., ABFM., CAQSM. Primary Care and [place] Instructor of [place] of [facility] of Medicine

## 2020-11-03 NOTE — Telephone Encounter (Signed)
The Suprax is on backorder and is not available at any of the [facility] pharmacies. [facility] can get a Suspension tomorrow. The suspension is 214m/5ml but they said they could "scale it up" to the dose needed.

## 2020-11-03 NOTE — Telephone Encounter (Signed)
Lets just do that, and she can do the metronidazole until they can get it tomorrow.

## 2020-11-28 ENCOUNTER — Encounter: Payer: Self-pay | Admitting: Family Medicine

## 2020-11-28 MED FILL — LEVOTHYROXINE SODIUM 200 MC: 200 | 90 days supply | Qty: 90 | Fill #1

## 2020-11-29 NOTE — Telephone Encounter (Signed)
See note

## 2020-12-02 ENCOUNTER — Other Ambulatory Visit: Payer: Self-pay | Admitting: *Deleted

## 2020-12-02 DIAGNOSIS — K580 Irritable bowel syndrome with diarrhea: Secondary | ICD-10-CM

## 2020-12-02 NOTE — Telephone Encounter (Signed)
Referral placed.

## 2020-12-05 ENCOUNTER — Other Ambulatory Visit: Payer: Self-pay | Admitting: Family Medicine

## 2020-12-05 MED FILL — OMEPRAZOLE 20 MG CAP: 20 | 90 days supply | Qty: 90 | Fill #0

## 2020-12-05 MED FILL — GABAPENTIN 600 MG TABLET: 600 | 30 days supply | Qty: 180 | Fill #3

## 2020-12-05 MED FILL — hydrOXYzine HCL 50 MG TABS: 50 | 30 days supply | Qty: 30 | Fill #2

## 2020-12-05 MED FILL — SUBVENITE 200 MG TABS: 200 | 90 days supply | Qty: 180 | Fill #0

## 2020-12-19 MED FILL — traMADol HCL 50 MG TABS: 50 | 15 days supply | Qty: 60 | Fill #2

## 2021-01-09 ENCOUNTER — Ambulatory Visit (INDEPENDENT_AMBULATORY_CARE_PROVIDER_SITE_OTHER): Payer: No Typology Code available for payment source | Admitting: Family Medicine

## 2021-01-09 ENCOUNTER — Encounter: Payer: Self-pay | Admitting: Family Medicine

## 2021-01-09 ENCOUNTER — Other Ambulatory Visit: Payer: Self-pay | Admitting: *Deleted

## 2021-01-09 ENCOUNTER — Other Ambulatory Visit: Payer: Self-pay

## 2021-01-09 VITALS — BP 109/75 | HR 67 | Temp 98.3°F | Ht 66.0 in | Wt 249.0 lb

## 2021-01-09 DIAGNOSIS — F319 Bipolar disorder, unspecified: Secondary | ICD-10-CM | POA: Diagnosis not present

## 2021-01-09 DIAGNOSIS — M5416 Radiculopathy, lumbar region: Secondary | ICD-10-CM

## 2021-01-09 DIAGNOSIS — Z0100 Encounter for examination of eyes and vision without abnormal findings: Secondary | ICD-10-CM

## 2021-01-09 DIAGNOSIS — M79674 Pain in right toe(s): Secondary | ICD-10-CM

## 2021-01-09 MED ORDER — KETOCONAZOLE 2 % EX CREA: 1.0000 "application " | TOPICAL_CREAM | Freq: Two times a day (BID) | CUTANEOUS | 0 refills | Status: DC

## 2021-01-09 MED FILL — KETOCONAZOLE 2 % CREA: 2 | 30 days supply | Qty: 60 | Fill #0

## 2021-01-09 NOTE — Patient Instructions (Signed)
It was very nice to see you today!  Please try the ketoconazole for your rash.  Let me know if not improving.  I will place a referral for sports medicine and ophthalmology as we discussed.  I will see you back in 6 months for your annual physical.  Come back to see me sooner if needed.  Take care, Dr Jerline Pain  Please try these tips to maintain a healthy lifestyle:   Eat at least 3 REAL meals and 1-2 snacks per day.  Aim for no more than 5 hours between eating.  If you eat breakfast, please do so within one hour of getting up.    Each meal should contain half fruits/vegetables, one quarter protein, and one quarter carbs (no bigger than a computer mouse)   Cut down on sweet beverages. This includes juice, soda, and sweet tea.     Drink at least 1 glass of water with each meal and aim for at least 8 glasses per day   Exercise at least 150 minutes every week.

## 2021-01-09 NOTE — Progress Notes (Signed)
   Nancy Mills is a 50 y.o. female who presents today for an office visit.  Assessment/Plan:  New/Acute Problems: Rash Likely candidal intertrigo.  Will start topical ketoconazole.  Discussed reasons to return to care.  Chronic Problems Addressed Today: Bipolar 1 disorder ([name]) He is under quite a bit of stress recently due to life eventsbut symptoms seem to be overall stable.  Continue Xanax.  Also continue Lamictal 300 mg daily, hydroxyzine 50 mg daily, and trazodone 50 mg nightly as needed.  Lumbar radiculopathy Has been following with sports medicine.  Needs a referral to follow-up with him today.  She is on tramadol and gabapentin which seem to be working well.     Subjective:  HPI:  See A/P for status of chronic conditions.  She has also developed a rash in her skin folds consistent with yeast infection.  She has had this in the past.  She has had burning and itching in the area also some redness.  She has tried using baby powder without significant improvement.       Objective:  Physical Exam: BP 109/75   Pulse 67   Temp 98.3 F (36.8 C) (Temporal)   Ht 5' 6"  (1.676 m)   Wt 249 lb (112.9 kg)   SpO2 97%   BMI 40.19 kg/m   Wt Readings from Last 3 Encounters:  01/09/21 249 lb (112.9 kg)  09/12/20 232 lb (105.2 kg)  08/08/20 227 lb (103 kg)    Gen: No acute distress, resting comfortably Psych: Normal affect and thought content      [name] Pain, MD 01/09/2021 8:46 AM

## 2021-01-09 NOTE — Assessment & Plan Note (Signed)
Has been following with sports medicine.  Needs a referral to follow-up with him today.  She is on tramadol and gabapentin which seem to be working well.

## 2021-01-09 NOTE — Assessment & Plan Note (Signed)
He is under quite a bit of stress recently due to life eventsbut symptoms seem to be overall stable.  Continue Xanax.  Also continue Lamictal 300 mg daily, hydroxyzine 50 mg daily, and trazodone 50 mg nightly as needed.

## 2021-01-18 ENCOUNTER — Ambulatory Visit (INDEPENDENT_AMBULATORY_CARE_PROVIDER_SITE_OTHER): Payer: No Typology Code available for payment source

## 2021-01-18 ENCOUNTER — Ambulatory Visit (INDEPENDENT_AMBULATORY_CARE_PROVIDER_SITE_OTHER): Payer: No Typology Code available for payment source | Admitting: Sports Medicine

## 2021-01-18 ENCOUNTER — Other Ambulatory Visit: Payer: Self-pay

## 2021-01-18 DIAGNOSIS — M1612 Unilateral primary osteoarthritis, left hip: Secondary | ICD-10-CM

## 2021-01-18 DIAGNOSIS — M5416 Radiculopathy, lumbar region: Secondary | ICD-10-CM | POA: Diagnosis not present

## 2021-01-18 MED ORDER — TRAMADOL HCL 50 MG PO TABS
50.0000 mg | ORAL_TABLET | Freq: Two times a day (BID) | ORAL | 2 refills | Status: DC | PRN
Start: 2021-01-18 — End: 2021-10-25

## 2021-01-18 NOTE — Progress Notes (Signed)
    Procedures performed today:    None.  Independent interpretation of notes and tests performed by another provider:   Lumbar spine x-rays from 10 months ago reviewed, I did see a glimpse of her left hip joint which did appear moderately arthritic with surrounding osteophytosis.  Brief History, Exam, Impression, and Recommendations:    Primary osteoarthritis of left hip 50 year old female, she has had a long history of left hip pain localized to the groin, occasional mechanical symptoms. On exam she has significant loss of internal rotation and pain consistent with a hip joint pain generator. I do suspect osteoarthritis, x-rays of the lumbar spine from 10 months ago reviewed, I do see significant hip joint osteoarthritis. Unable to take NSAIDs due to prior gastric surgery, we will refill tramadol to be used twice a day, getting some dedicated hip x-rays, hip conditioning exercises given, return to see me in 2 to 3 weeks for an ultrasound-guided hip joint injection if not sufficiently better.    ___________________________________________ Gwen Her. Dianah Field, M.D., ABFM., CAQSM. Primary Care and [place] Instructor of [place] of [facility] of Medicine

## 2021-01-18 NOTE — Assessment & Plan Note (Addendum)
50 year old female, she has had a long history of left hip pain localized to the groin, occasional mechanical symptoms. On exam she has significant loss of internal rotation and pain consistent with a hip joint pain generator. I do suspect osteoarthritis, x-rays of the lumbar spine from 10 months ago reviewed, I do see significant hip joint osteoarthritis. Unable to take NSAIDs due to prior gastric surgery, we will refill tramadol to be used twice a day, getting some dedicated hip x-rays, hip conditioning exercises given, return to see me in 2 to 3 weeks for an ultrasound-guided hip joint injection if not sufficiently better.

## 2021-01-19 ENCOUNTER — Ambulatory Visit (INDEPENDENT_AMBULATORY_CARE_PROVIDER_SITE_OTHER): Payer: No Typology Code available for payment source | Admitting: Gastroenterology

## 2021-01-19 ENCOUNTER — Encounter: Payer: Self-pay | Admitting: Gastroenterology

## 2021-01-19 ENCOUNTER — Other Ambulatory Visit: Payer: Self-pay

## 2021-01-19 VITALS — BP 114/77 | HR 88 | Ht 66.0 in | Wt 252.0 lb

## 2021-01-19 DIAGNOSIS — K7460 Unspecified cirrhosis of liver: Secondary | ICD-10-CM | POA: Diagnosis not present

## 2021-01-19 DIAGNOSIS — K7581 Nonalcoholic steatohepatitis (NASH): Secondary | ICD-10-CM | POA: Diagnosis not present

## 2021-01-19 MED ORDER — OMEPRAZOLE 20 MG PO CPDR: 20.0000 mg | DELAYED_RELEASE_CAPSULE | Freq: Every day | ORAL | 3 refills | Status: DC

## 2021-01-19 NOTE — Progress Notes (Signed)
Primary Care Physician: Vivi Barrack, MD  Primary Gastroenterologist:  Dr. Lucilla Lame  Chief Complaint  Patient presents with  . Cirrhosis    6 month follow up     HPI: Nancy Mills is a 50 y.o. female here for follow-up of cirrhosis.  The patient reports that she's had biopsy-proven cirrhosis and portal hypertension.  Most recent ultrasound showed fatty liver without any signs of portal hypertension.  She states that she has been gaining weight recently and she is not sure why. She denies any abdominal pain nausea vomiting fevers chills black stools or bloody stools.  Patient has also had gastric bypass surgery.  Past Medical History:  Diagnosis Date  . Adjustment disorder with mixed anxiety and depressed mood   . Anxiety   . Asthma   . Bipolar 1 disorder ([place])   . Depression   . Diabetes mellitus without complication ([place])   . HSV-2 infection   . Liver cirrhosis secondary to NASH ([place])   . Migraines   . Severe obesity ([place])   . Thyroid disease     Current Outpatient Medications  Medication Sig Dispense Refill  . albuterol (PROVENTIL HFA;VENTOLIN HFA) 108 (90 BASE) MCG/ACT inhaler Inhale 2 puffs into the lungs every 6 (six) hours as needed for wheezing.     Marland Kitchen ALPRAZolam (XANAX) 1 MG tablet Take 1 tablet (1 mg total) by mouth 2 (two) times daily as needed for anxiety. 782 tablet 1  . folic acid (FOLVITE) 956 MCG tablet Take 400 mcg by mouth daily.    Marland Kitchen gabapentin (NEURONTIN) 600 MG tablet Two tabs PO TID (Patient taking differently: Take 600 mg by mouth 3 (three) times daily.) 180 tablet 3  . hydrOXYzine (ATARAX/VISTARIL) 50 MG tablet Take 50 mg by mouth at bedtime as needed for anxiety.     Marland Kitchen ketoconazole (NIZORAL) 2 % cream Apply 1 application topically 2 (two) times daily. 60 g 0  . levonorgestrel (MIRENA) 20 MCG/24HR IUD 1 each by Intrauterine route once.    Marland Kitchen levothyroxine (SYNTHROID) 200 MCG tablet Take 1 tablet (200 mcg total) by mouth daily. 90 tablet 3   . Multiple Vitamins-Minerals (OPURITY BYPASS OPTIMIZED) CHEW Chew 1 tablet by mouth daily.     . ondansetron (ZOFRAN) 4 MG tablet Take 1 tablet (4 mg total) by mouth every 6 (six) hours. (Patient taking differently: Take 4 mg by mouth every 8 (eight) hours as needed for nausea.) 12 tablet 0  . Probiotic Product (PROBIOTIC-10 PO) Take 1 capsule by mouth daily.     Marland Kitchen senna-docusate (SENOKOT-S) 8.6-50 MG tablet Take 1 tablet by mouth daily.     . SUBVENITE 200 MG tablet TAKE 1 TABLET BY MOUTH TWO TIMES DAILY 180 tablet 1  . traMADol (ULTRAM) 50 MG tablet Take 1-2 tablets (50-100 mg total) by mouth 2 (two) times daily as needed for moderate pain. Maximum 6 tabs per day. 60 tablet 2  . traZODone (DESYREL) 50 MG tablet Take 50 mg by mouth at bedtime as needed for sleep.     . valACYclovir (VALTREX) 500 MG tablet TAKE 2 TABLETS BY MOUTH ONCE DAILY 180 tablet 1  . vitamin B-12 (CYANOCOBALAMIN) 100 MCG tablet Take 100 mcg by mouth daily.    Marland Kitchen omeprazole (PRILOSEC) 20 MG capsule Take 1 capsule (20 mg total) by mouth daily. 90 capsule 3   No current facility-administered medications for this visit.    Allergies as of 01/19/2021 - Review Complete 01/19/2021  Allergen Reaction  Noted  . Other Anaphylaxis 07/12/2020  . Clindamycin/lincomycin Diarrhea 01/01/2017  . Latex Rash 01/01/2017  . Nsaids  10/13/2019  . Erythromycin  10/24/2012  . Penicillins  10/24/2012  . Chicken allergy Other (See Comments) 06/10/2019  . Codeine  10/24/2012  . Morphine and related Nausea And Vomiting 02/07/2020    ROS:  General: Negative for anorexia, weight loss, fever, chills, fatigue, weakness. ENT: Negative for hoarseness, difficulty swallowing , nasal congestion. CV: Negative for chest pain, angina, palpitations, dyspnea on exertion, peripheral edema.  Respiratory: Negative for dyspnea at rest, dyspnea on exertion, cough, sputum, wheezing.  GI: See history of present illness. GU:  Negative for dysuria, hematuria,  urinary incontinence, urinary frequency, nocturnal urination.  Endo: Negative for unusual weight change.    Physical Examination:   BP 114/77   Pulse 88   Ht 5' 6"  (1.676 m)   Wt 252 lb (114.3 kg)   BMI 40.67 kg/m   General: Well-nourished, well-developed in no acute distress.  Eyes: No icterus. Conjunctivae pink. Lungs: Clear to auscultation bilaterally. Non-labored. Heart: Regular rate and rhythm, no murmurs rubs or gallops.  Abdomen: Bowel sounds are normal, nontender, nondistended, no hepatosplenomegaly or masses, no abdominal bruits or hernia , no rebound or guarding.   Extremities: No lower extremity edema. No clubbing or deformities. Neuro: Alert and oriented x 3.  Grossly intact. Skin: Warm and dry, no jaundice.   Psych: Alert and cooperative, normal mood and affect.  Labs:    Imaging Studies: DG HIP UNILAT W OR W/O PELVIS 2-3 VIEWS LEFT  Result Date: 01/19/2021 CLINICAL DATA:  Left groin pain.  Question osteoarthritis. EXAM: DG HIP (WITH OR WITHOUT PELVIS) 2-3V LEFT COMPARISON:  None. FINDINGS: No fracture. No subluxation or dislocation. SI joints and symphysis pubis unremarkable. IUD projects over the left paramidline pelvis. Joint space in the hips is symmetric and relatively well preserved. There is hypertrophic spurring in the left femoral head with trace spurring noted in the roof of the left acetabulum. No worrisome lytic or sclerotic osseous abnormality. IMPRESSION: Degenerative spurring in the left femoral head with preservation of joint space. No acute bony abnormality. Electronically Signed   By: Misty Stanley M.D.   On: 01/19/2021 10:51    Assessment and Plan:   Nancy Mills is a 50 y.o. y/o female Who comes in today with a history of cirrhosis secondary to fatty liver disease.  The patient has no complaints at the present time and will be set up for a ultrasound of the abdomen to rule out hepatocellular carcinoma and also to look for any sign of ascites  is the cause of her weight gain. The patient also will be set up for an EGD due to her cirrhosis to look for esophageal varices and she will also be set up for screening colonoscopy.  The patient has been spending the plan and agrees with it.     Lucilla Lame, MD. [facility]    Note: This dictation was prepared with Dragon dictation along with smaller phrase technology. Any transcriptional errors that result from this process are unintentional.

## 2021-01-23 ENCOUNTER — Encounter: Payer: Self-pay | Admitting: Family Medicine

## 2021-01-26 ENCOUNTER — Other Ambulatory Visit (HOSPITAL_BASED_OUTPATIENT_CLINIC_OR_DEPARTMENT_OTHER): Payer: Self-pay

## 2021-01-26 ENCOUNTER — Other Ambulatory Visit: Payer: No Typology Code available for payment source

## 2021-01-30 ENCOUNTER — Other Ambulatory Visit: Payer: Self-pay | Admitting: *Deleted

## 2021-01-30 MED ORDER — HYDROXYZINE HCL 50 MG PO TABS: 50.0000 mg | ORAL_TABLET | Freq: Every evening | ORAL | 0 refills | Status: DC | PRN

## 2021-01-30 NOTE — Telephone Encounter (Signed)
Has to be sent in to the [facility]

## 2021-01-30 NOTE — Telephone Encounter (Signed)
Pt is still needing this medication sent in. She would like it sent to [facility] order pharmacy

## 2021-01-30 NOTE — Telephone Encounter (Signed)
Rx send in

## 2021-02-02 ENCOUNTER — Ambulatory Visit (INDEPENDENT_AMBULATORY_CARE_PROVIDER_SITE_OTHER): Payer: No Typology Code available for payment source

## 2021-02-02 ENCOUNTER — Other Ambulatory Visit: Payer: Self-pay

## 2021-02-02 DIAGNOSIS — K7581 Nonalcoholic steatohepatitis (NASH): Secondary | ICD-10-CM | POA: Diagnosis not present

## 2021-02-02 DIAGNOSIS — K7460 Unspecified cirrhosis of liver: Secondary | ICD-10-CM | POA: Diagnosis not present

## 2021-02-03 MED FILL — hydrOXYzine HCL 50 MG TABS: 50 | 90 days supply | Qty: 90 | Fill #0

## 2021-02-06 ENCOUNTER — Telehealth: Payer: Self-pay

## 2021-02-06 NOTE — Telephone Encounter (Signed)
Pt notified of [place] results through mychart.

## 2021-02-06 NOTE — Telephone Encounter (Signed)
-----   Message from Lucilla Lame, MD sent at 02/06/2021  8:17 AM EDT ----- Let the patient know that the ultrasound showed her cirrhosis with fatty liver but no masses.

## 2021-02-08 ENCOUNTER — Other Ambulatory Visit: Payer: Self-pay

## 2021-02-08 ENCOUNTER — Ambulatory Visit (INDEPENDENT_AMBULATORY_CARE_PROVIDER_SITE_OTHER): Payer: No Typology Code available for payment source | Admitting: Sports Medicine

## 2021-02-08 ENCOUNTER — Ambulatory Visit (INDEPENDENT_AMBULATORY_CARE_PROVIDER_SITE_OTHER): Payer: No Typology Code available for payment source

## 2021-02-08 DIAGNOSIS — M1612 Unilateral primary osteoarthritis, left hip: Secondary | ICD-10-CM

## 2021-02-08 NOTE — Progress Notes (Signed)
    Procedures performed today:    Procedure: Real-time Ultrasound Guided injection of the left hip joint Device: Samsung [id number]  Verbal informed consent obtained.  Time-out conducted.  Noted no overlying erythema, induration, or other signs of local infection.  Skin prepped in a sterile fashion.  Local anesthesia: Topical Ethyl chloride.  With sterile technique and under real time ultrasound guidance:  Noted arthritic changes, 1 cc Kenalog 40, 2 cc lidocaine, 2 cc bupivacaine injected easily Completed without difficulty  Advised to call if fevers/chills, erythema, induration, drainage, or persistent bleeding.  Images permanently stored and available for review in PACS.  Impression: Technically successful ultrasound guided injection.  Independent interpretation of notes and tests performed by another provider:   None.  Brief History, Exam, Impression, and Recommendations:    Primary osteoarthritis of left hip This is a pleasant 50 year old female with a long history of left hip pain localized to the groin with occasional mechanical symptoms, we tried conservative treatment, held off on NSAIDs due to prior gastric surgery, used tramadol, hip conditioning exercises and got x-rays. X-rays confirmed osteoarthritis, no improvement with conservative treatment so we performed a left hip joint injection today. Return to see me in a month.    ___________________________________________ Gwen Her. Dianah Field, M.D., ABFM., CAQSM. Primary Care and [place] Instructor of [place] of [facility] of Medicine

## 2021-02-08 NOTE — Assessment & Plan Note (Signed)
This is a pleasant 49 year old female with a long history of left hip pain localized to the groin with occasional mechanical symptoms, we tried conservative treatment, held off on NSAIDs due to prior gastric surgery, used tramadol, hip conditioning exercises and got x-rays. X-rays confirmed osteoarthritis, no improvement with conservative treatment so we performed a left hip joint injection today. Return to see me in a month.

## 2021-02-16 ENCOUNTER — Other Ambulatory Visit (HOSPITAL_BASED_OUTPATIENT_CLINIC_OR_DEPARTMENT_OTHER): Payer: Self-pay

## 2021-02-16 MED ORDER — TRAMADOL HCL 50 MG PO TABS
50.0000 mg | ORAL_TABLET | ORAL | 1 refills | Status: DC
  Filled 2021-02-16: qty 60, 10d supply, fill #0

## 2021-02-22 ENCOUNTER — Other Ambulatory Visit: Payer: Self-pay

## 2021-02-22 ENCOUNTER — Ambulatory Visit
Admission: RE | Admit: 2021-02-22 | Discharge: 2021-02-22 | Disposition: A | Discharge status: Home / Self Care | Payer: No Typology Code available for payment source | Source: Ambulatory Visit | Attending: Obstetrics and Gynecology | Admitting: Obstetrics and Gynecology

## 2021-02-22 DIAGNOSIS — N6489 Other specified disorders of breast: Secondary | ICD-10-CM

## 2021-02-28 ENCOUNTER — Other Ambulatory Visit (HOSPITAL_COMMUNITY): Payer: Self-pay

## 2021-02-28 MED FILL — Levothyroxine Sodium Tab 200 MCG: ORAL | 90 days supply | Qty: 90 | Fill #0 | Status: AC

## 2021-03-08 ENCOUNTER — Ambulatory Visit (INDEPENDENT_AMBULATORY_CARE_PROVIDER_SITE_OTHER): Payer: No Typology Code available for payment source | Admitting: Sports Medicine

## 2021-03-08 ENCOUNTER — Other Ambulatory Visit: Payer: Self-pay

## 2021-03-08 DIAGNOSIS — M1612 Unilateral primary osteoarthritis, left hip: Secondary | ICD-10-CM

## 2021-03-08 NOTE — Progress Notes (Signed)
    Procedures performed today:    None.  Independent interpretation of notes and tests performed by another provider:   None.  Brief History, Exam, Impression, and Recommendations:    Primary osteoarthritis of left hip Pleasant 50 year old female with long history of left hip pain, confirmed osteoarthritis on x-rays. Held off on NSAIDs due to prior gastric surgery, tramadol, conditioning exercises were insufficiently efficacious, we injected her hip at the last visit, she did really well initially for a couple of weeks, rolled over and felt a pop and had some increasing pain. Overall doing okay, tramadol seems to control pain well, happy to refill this chronically, we can also do another injection no more than 3-4 times a year. I think the mechanical symptom was likely internal coxa saltans and will likely resolve on its own. Follow-up as needed for this.    ___________________________________________ Gwen Her. Dianah Field, M.D., ABFM., CAQSM. Primary Care and [place] Instructor of [place] of [facility] of Medicine

## 2021-03-08 NOTE — Assessment & Plan Note (Signed)
Pleasant 50 year old female with long history of left hip pain, confirmed osteoarthritis on x-rays. Held off on NSAIDs due to prior gastric surgery, tramadol, conditioning exercises were insufficiently efficacious, we injected her hip at the last visit, she did really well initially for a couple of weeks, rolled over and felt a pop and had some increasing pain. Overall doing okay, tramadol seems to control pain well, happy to refill this chronically, we can also do another injection no more than 3-4 times a year. I think the mechanical symptom was likely internal coxa saltans and will likely resolve on its own. Follow-up as needed for this.

## 2021-03-19 MED FILL — Valacyclovir HCl Tab 500 MG: ORAL | 90 days supply | Qty: 180 | Fill #0 | Status: AC

## 2021-03-19 MED FILL — Lamotrigine Tab 200 MG: ORAL | 90 days supply | Qty: 180 | Fill #0 | Status: AC

## 2021-03-19 MED FILL — Omeprazole Cap Delayed Release 20 MG: ORAL | 90 days supply | Qty: 90 | Fill #0 | Status: AC

## 2021-03-20 ENCOUNTER — Other Ambulatory Visit (HOSPITAL_COMMUNITY): Payer: Self-pay

## 2021-03-27 ENCOUNTER — Encounter: Payer: Self-pay | Admitting: Family Medicine

## 2021-03-28 NOTE — Telephone Encounter (Signed)
Please can you check on this

## 2021-03-30 ENCOUNTER — Other Ambulatory Visit (HOSPITAL_BASED_OUTPATIENT_CLINIC_OR_DEPARTMENT_OTHER): Payer: Self-pay

## 2021-03-30 ENCOUNTER — Other Ambulatory Visit (HOSPITAL_COMMUNITY): Payer: Self-pay

## 2021-03-30 ENCOUNTER — Other Ambulatory Visit: Payer: Self-pay | Admitting: Sports Medicine

## 2021-03-30 DIAGNOSIS — M5416 Radiculopathy, lumbar region: Secondary | ICD-10-CM

## 2021-03-30 MED ORDER — GABAPENTIN 600 MG PO TABS
ORAL_TABLET | Freq: Three times a day (TID) | ORAL | 3 refills | Status: DC
  Filled 2021-03-30: qty 180, 30d supply, fill #0
  Filled 2021-10-26: qty 180, 30d supply, fill #1

## 2021-03-30 MED FILL — Tramadol HCl Tab 50 MG: ORAL | 30 days supply | Qty: 60 | Fill #0 | Status: AC

## 2021-03-31 ENCOUNTER — Other Ambulatory Visit (HOSPITAL_BASED_OUTPATIENT_CLINIC_OR_DEPARTMENT_OTHER): Payer: Self-pay

## 2021-04-07 ENCOUNTER — Encounter: Payer: Self-pay | Admitting: Registered Nurse

## 2021-04-07 ENCOUNTER — Other Ambulatory Visit: Payer: Self-pay

## 2021-04-07 ENCOUNTER — Telehealth (INDEPENDENT_AMBULATORY_CARE_PROVIDER_SITE_OTHER): Payer: No Typology Code available for payment source | Admitting: Registered Nurse

## 2021-04-07 VITALS — Temp 98.9°F

## 2021-04-07 DIAGNOSIS — U071 COVID-19: Secondary | ICD-10-CM | POA: Diagnosis not present

## 2021-04-07 DIAGNOSIS — R0981 Nasal congestion: Secondary | ICD-10-CM

## 2021-04-07 MED ORDER — MOLNUPIRAVIR EUA 200MG CAPSULE: 4.0000 | ORAL_CAPSULE | Freq: Two times a day (BID) | ORAL | 0 refills | Status: AC

## 2021-04-07 MED ORDER — AZELASTINE HCL 0.1 % NA SOLN: 1.0000 | Freq: Two times a day (BID) | NASAL | 12 refills | Status: DC

## 2021-04-07 NOTE — Progress Notes (Signed)
Telemedicine Encounter- SOAP NOTE Established Patient  This telephone encounter was conducted with the patient's (or proxy's) verbal consent via audio telecommunications: yes  Patient was instructed to have this encounter in a suitably private space; and to only have persons present to whom they give permission to participate. In addition, patient identity was confirmed by use of name plus two identifiers (DOB and address).  I discussed the limitations, risks, security and privacy concerns of performing an evaluation and management service by telephone and the availability of in person appointments. I also discussed with the patient that there may be a patient responsible charge related to this service. The patient expressed understanding and agreed to proceed.  I spent a total of 16 minutes talking with the patient or their proxy.  Patient at home Provider in office  Participants: Kathrin Ruddy, NP and Annitta Needs  Chief Complaint  Patient presents with  . Covid Positive    Patient states she tested positive for covid on 04/04/2021 but has ben having symptoms since last week.She is still experiencing cough , body aches , fatigue, ear congestion, cold chills, and hot flashes. She is only taking tylenol at the moment with no relief.    Subjective   Nancy Mills is a 50 y.o. established patient. Telephone visit today for COVID+  HPI Symptoms onset 03/29/21 Positive test on 04/04/21 Symptoms overall improving Has been taking tylenol  Had COVID Sept 2020 Vaccine in March 2021 - AEs of lightheadedness, LOC, GI side effects, numbness and tingling  No new symptoms Cough seems to be a little "wetter", still not productive  Patient Active Problem List   Diagnosis Date Noted  . Primary osteoarthritis of left hip 01/18/2021  . Pain of right great toe 08/17/2020  . S/P gastric bypass 04/26/2020  . Spinal stenosis of lumbar region 03/05/2019  . Lumbar radiculopathy  03/05/2019  . Bipolar 1 disorder ([place]) 03/27/2018  . HSV-2 infection 05/21/2017  . Liver cirrhosis secondary to NASH ([place]) 05/09/2016  . Portal venous hypertension ([place]) 05/09/2016  . Irritable bowel syndrome with diarrhea 05/09/2016  . Hypothyroidism 02/09/2016    Past Medical History:  Diagnosis Date  . Adjustment disorder with mixed anxiety and depressed mood   . Anxiety   . Asthma   . Bipolar 1 disorder ([place])   . Depression   . Diabetes mellitus without complication ([place])   . HSV-2 infection   . Liver cirrhosis secondary to NASH ([place])   . Migraines   . Severe obesity ([place])   . Thyroid disease     Current Outpatient Medications  Medication Sig Dispense Refill  . albuterol (PROVENTIL HFA;VENTOLIN HFA) 108 (90 BASE) MCG/ACT inhaler Inhale 2 puffs into the lungs every 6 (six) hours as needed for wheezing.     Marland Kitchen ALPRAZolam (XANAX) 1 MG tablet Take 1 tablet (1 mg total) by mouth 2 (two) times daily as needed for anxiety. 716 tablet 1  . folic acid (FOLVITE) 967 MCG tablet Take 400 mcg by mouth daily.    Marland Kitchen gabapentin (NEURONTIN) 600 MG tablet TAKE 2 TABLETS BY MOUTH THREE TIMES DAILY 180 tablet 3  . hydrOXYzine (ATARAX/VISTARIL) 50 MG tablet TAKE 1 TABLET BY MOUTH ONCE DAILY AT BEDTIME AS NEEDED FOR ANXIETY 90 tablet 0  . ketoconazole (NIZORAL) 2 % cream APPLY 1 APPLICATION TOPICALLY 2 (TWO) TIMES DAILY. 60 g 0  . lamoTRIgine (LAMICTAL) 200 MG tablet TAKE 1 TABLET BY MOUTH TWO TIMES DAILY 180 tablet 1  . levonorgestrel (  MIRENA) 20 MCG/24HR IUD 1 each by Intrauterine route once.    Marland Kitchen levothyroxine (SYNTHROID) 200 MCG tablet TAKE 1 TABLET BY MOUTH ONCE A DAY 90 tablet 3  . metroNIDAZOLE (FLAGYL) 500 MG tablet TAKE 1 TABLET (500 MG TOTAL) BY MOUTH 2 (TWO) TIMES DAILY FOR 7 DAYS. 14 tablet 0  . Multiple Vitamins-Minerals (OPURITY BYPASS OPTIMIZED) CHEW Chew 1 tablet by mouth daily.     Marland Kitchen omeprazole (PRILOSEC) 20 MG capsule TAKE 1 CAPSULE BY MOUTH ONCE A DAY 90 capsule 3  . omeprazole  (PRILOSEC) 20 MG capsule TAKE 1 CAPSULE BY MOUTH DAILY. 90 capsule 1  . ondansetron (ZOFRAN) 4 MG tablet Take 1 tablet (4 mg total) by mouth every 6 (six) hours. (Patient taking differently: Take 4 mg by mouth every 8 (eight) hours as needed for nausea.) 12 tablet 0  . Probiotic Product (PROBIOTIC-10 PO) Take 1 capsule by mouth daily.     Marland Kitchen senna-docusate (SENOKOT-S) 8.6-50 MG tablet Take 1 tablet by mouth daily.     . traMADol (ULTRAM) 50 MG tablet Take 1-2 tablets (50-100 mg total) by mouth 2 (two) times daily as needed for moderate pain. Maximum 6 tabs per day. 60 tablet 2  . traMADol (ULTRAM) 50 MG tablet Take 1-2 tablets by mouth twice daily as needed for moderate pain. Max 6 tabs/day. 60 tablet 1  . traMADol (ULTRAM) 50 MG tablet Take 1-2 tablets by mouth twice daily as needed for moderate pain. Max 6 tabs/day. 60 tablet 1  . traZODone (DESYREL) 50 MG tablet Take 50 mg by mouth at bedtime as needed for sleep.     . valACYclovir (VALTREX) 500 MG tablet TAKE 2 TABLETS BY MOUTH ONCE A DAY 180 tablet 1  . vitamin B-12 (CYANOCOBALAMIN) 100 MCG tablet Take 100 mcg by mouth daily.     No current facility-administered medications for this visit.    Allergies  Allergen Reactions  . Other Anaphylaxis    Covi-19 Vaccine (Pf[place])  . Clindamycin/Lincomycin Diarrhea    Severe diarrhea, cannot tolerate  . Latex Rash  . Nsaids     Due to history of gastric bypass  . Erythromycin   . Penicillins   . Chicken Allergy Other (See Comments)    Gastric bypass surgery, can not digest   . Codeine   . Morphine And Related Nausea And Vomiting    Social History   Socioeconomic History  . Marital status: Legally Separated    Spouse name: Not on file  . Number of children: Not on file  . Years of education: Not on file  . Highest education level: Not on file  Occupational History  . Not on file  Tobacco Use  . Smoking status: Current Every Day Smoker    Packs/day: 0.50    Years: 20.00    Pack  years: 10.00  . Smokeless tobacco: Never Used  Substance and Sexual Activity  . Alcohol use: Yes  . Drug use: No  . Sexual activity: Yes    Partners: Male    Birth control/protection: I.U.D.  Other Topics Concern  . Not on file  Social History Narrative  . Not on file   Social Determinants of Health   Financial Resource Strain: Not on file  Food Insecurity: Not on file  Transportation Needs: Not on file  Physical Activity: Not on file  Stress: Not on file  Social Connections: Not on file  Intimate Partner Violence: Not on file    Review of Systems  Constitutional:  Negative.   HENT: Positive for congestion.   Eyes: Negative.   Respiratory: Positive for cough. Negative for sputum production and shortness of breath.   Cardiovascular: Negative.   Gastrointestinal: Negative.   Genitourinary: Negative.   Musculoskeletal: Negative.   Skin: Negative.   Neurological: Negative.   Endo/Heme/Allergies: Negative.   Psychiatric/Behavioral: Negative.   All other systems reviewed and are negative.   Objective   Vitals as reported by the patient: Today's Vitals   04/07/21 0810  Temp: 98.9 F (37.2 C)    There are no diagnoses linked to this encounter.  PLAN  molnupiravir as above. Azelastine for congestion relief.  Given onset of symptoms vs. Positive test, there is good potential she is no longer contagious - urged extreme precautions regardless with masking and social distancing through at least 10 days from symptom onset.  Patient encouraged to call clinic with any questions, comments, or concerns.  I discussed the assessment and treatment plan with the patient. The patient was provided an opportunity to ask questions and all were answered. The patient agreed with the plan and demonstrated an understanding of the instructions.   The patient was advised to call back or seek an in-person evaluation if the symptoms worsen or if the condition fails to improve as  anticipated.  I provided 16 minutes of non-face-to-face time during this encounter.  Maximiano Coss, NP  Primary Care at [facility]

## 2021-05-02 ENCOUNTER — Ambulatory Visit (INDEPENDENT_AMBULATORY_CARE_PROVIDER_SITE_OTHER): Payer: No Typology Code available for payment source | Admitting: Medical-Surgical

## 2021-05-02 ENCOUNTER — Other Ambulatory Visit: Payer: Self-pay

## 2021-05-02 DIAGNOSIS — R3989 Other symptoms and signs involving the genitourinary system: Secondary | ICD-10-CM | POA: Diagnosis not present

## 2021-05-02 NOTE — Progress Notes (Signed)
Subjective:    CC: dark urine  HPI: Pleasant 50 year old female presenting today for evaluation of dark urine. Woke up this morning and noticed that her urine was a dark yellow/orange color. Has also had some back pain for a few days. Recently spent the weekend at the beach with increased activity and walking on the newly laid sand. Reports that she had been drinking plenty of water lately. + mild suprapubic pressure but no fever, chills, hematuria, burning, frequency, or urgency.   I reviewed the past medical history, family history, social history, surgical history, and allergies today and no changes were needed.  Please see the problem list section below in epic for further details.  Past Medical History: Past Medical History:  Diagnosis Date   Adjustment disorder with mixed anxiety and depressed mood    Anxiety    Asthma    Bipolar 1 disorder ([place])    Depression    Diabetes mellitus without complication ([place])    HSV-2 infection    Liver cirrhosis secondary to NASH ([place])    Migraines    Severe obesity ([facility])    Thyroid disease    Past Surgical History: Past Surgical History:  Procedure Laterality Date   ABDOMINAL SURGERY     abdomnal wall reconstruction     CESAREAN SECTION  2001 2004   CHOLECYSTECTOMY     RIGHT OOPHORECTOMY     Social History: Social History   Socioeconomic History   Marital status: Legally Separated    Spouse name: Not on file   Number of children: Not on file   Years of education: Not on file   Highest education level: Not on file  Occupational History   Not on file  Tobacco Use   Smoking status: Every Day    Packs/day: 0.50    Years: 20.00    Pack years: 10.00    Types: Cigarettes   Smokeless tobacco: Never  Substance and Sexual Activity   Alcohol use: Yes   Drug use: No   Sexual activity: Yes    Partners: Male    Birth control/protection: I.U.D.  Other Topics Concern   Not on file  Social History Narrative   Not on file   Social  Determinants of Health   Financial Resource Strain: Not on file  Food Insecurity: Not on file  Transportation Needs: Not on file  Physical Activity: Not on file  Stress: Not on file  Social Connections: Not on file   Family History: Family History  Problem Relation Age of Onset   Depression Mother    Mental illness Mother    Cancer Father    Depression Father    Diabetes Father    Depression Brother    Heart attack Maternal Grandfather    Heart disease Maternal Grandfather    High blood pressure Maternal Grandfather    Stroke Maternal Grandfather    COPD Paternal Grandmother    COPD Paternal Grandfather    Heart attack Paternal Grandfather    High blood pressure Paternal Grandfather    Allergies: Allergies  Allergen Reactions   Other Anaphylaxis    Covi-19 Vaccine (Pfiser)   Clindamycin/Lincomycin Diarrhea    Severe diarrhea, cannot tolerate   Latex Rash   Nsaids     Due to history of gastric bypass   Erythromycin    Penicillins    Chicken Allergy Other (See Comments)    Gastric bypass surgery, can not digest    Codeine    Morphine And Related Nausea  And Vomiting   Medications: See med rec.  Review of Systems: See HPI for pertinent positives and negatives.   Objective:    General: Well Developed, well nourished, and in no acute distress.  Neuro: Alert and oriented x 3.  HEENT: Normocephalic, atraumatic.  Skin: Warm and dry. Cardiac: Regular rate and rhythm.  Respiratory: Not using accessory muscles, speaking in full sentences.  Impression and Recommendations:    1. Dark yellow-colored urine POCT UA negative for nitrites, leukocytes, and blood but positive for small ketones, moderate bilirubin, and protein. Specific gravity elevated. Suspect dehydration is a factor here. Despite increased water consumption, she was also exposed to quite a bit of sun with an elevated body temperature. Recommend monitoring for other symptoms but make sure to hydrate very well  over the next 24-48 hours. If urine color remains dark or other symptoms develop, can recheck urine and send for culture. Can also check CBC and CMP if dark urine persists.   Return if symptoms worsen or fail to improve. ___________________________________________ Clearnce Sorrel, DNP, APRN, FNP-BC [facility] [place]

## 2021-05-07 ENCOUNTER — Other Ambulatory Visit: Payer: Self-pay | Admitting: Family Medicine

## 2021-05-09 ENCOUNTER — Other Ambulatory Visit (HOSPITAL_COMMUNITY): Payer: Self-pay

## 2021-05-09 MED ORDER — HYDROXYZINE HCL 50 MG PO TABS
50.0000 mg | ORAL_TABLET | Freq: Every evening | ORAL | 0 refills | Status: DC | PRN
  Filled 2021-05-09: qty 90, 90d supply, fill #0

## 2021-05-23 MED FILL — Tramadol HCl Tab 50 MG: ORAL | 30 days supply | Qty: 60 | Fill #1 | Status: AC

## 2021-05-24 ENCOUNTER — Other Ambulatory Visit (HOSPITAL_BASED_OUTPATIENT_CLINIC_OR_DEPARTMENT_OTHER): Payer: Self-pay

## 2021-06-20 ENCOUNTER — Other Ambulatory Visit: Payer: Self-pay | Admitting: Medical-Surgical

## 2021-06-20 ENCOUNTER — Other Ambulatory Visit (HOSPITAL_BASED_OUTPATIENT_CLINIC_OR_DEPARTMENT_OTHER): Payer: Self-pay

## 2021-06-20 MED ORDER — SCOPOLAMINE 1 MG/3DAYS TD PT72
1.0000 | MEDICATED_PATCH | TRANSDERMAL | 0 refills | Status: DC
  Filled 2021-06-20: qty 4, 12d supply, fill #0

## 2021-06-26 ENCOUNTER — Ambulatory Visit (INDEPENDENT_AMBULATORY_CARE_PROVIDER_SITE_OTHER): Payer: No Typology Code available for payment source | Admitting: Sports Medicine

## 2021-06-26 DIAGNOSIS — M5416 Radiculopathy, lumbar region: Secondary | ICD-10-CM

## 2021-06-26 MED ORDER — KETOROLAC TROMETHAMINE 30 MG/ML IJ SOLN
30.0000 mg | Freq: Once | INTRAMUSCULAR | Status: AC
  Administered 2021-06-26: 30 mg via INTRAMUSCULAR

## 2021-06-26 NOTE — Progress Notes (Signed)
   Subjective:    Patient ID: Nancy Mills, female    DOB: 1971-04-18, 50 y.o.   MRN: [id number]  HPI    Review of Systems     Objective:   Physical Exam        Assessment & Plan:  Patient tolerated injection well with no complications.

## 2021-06-27 ENCOUNTER — Other Ambulatory Visit (HOSPITAL_COMMUNITY): Payer: Self-pay

## 2021-06-27 ENCOUNTER — Other Ambulatory Visit: Payer: Self-pay | Admitting: Family Medicine

## 2021-06-27 MED FILL — Omeprazole Cap Delayed Release 20 MG: ORAL | 90 days supply | Qty: 90 | Fill #0 | Status: AC

## 2021-06-27 NOTE — Assessment & Plan Note (Signed)
Increasing back pain, Toradol 30 intramuscular.

## 2021-06-28 ENCOUNTER — Other Ambulatory Visit: Payer: Self-pay | Admitting: Family Medicine

## 2021-06-28 ENCOUNTER — Other Ambulatory Visit (HOSPITAL_COMMUNITY): Payer: Self-pay

## 2021-06-28 MED FILL — Lamotrigine Tab 200 MG: ORAL | 90 days supply | Qty: 180 | Fill #0 | Status: AC

## 2021-06-28 MED FILL — Levothyroxine Sodium Tab 200 MCG: ORAL | 90 days supply | Qty: 90 | Fill #1 | Status: AC

## 2021-07-04 ENCOUNTER — Other Ambulatory Visit (HOSPITAL_COMMUNITY): Payer: Self-pay

## 2021-07-13 ENCOUNTER — Other Ambulatory Visit: Payer: Self-pay

## 2021-07-13 ENCOUNTER — Encounter: Payer: Self-pay | Admitting: Family Medicine

## 2021-07-13 ENCOUNTER — Ambulatory Visit (INDEPENDENT_AMBULATORY_CARE_PROVIDER_SITE_OTHER): Payer: No Typology Code available for payment source | Admitting: Family Medicine

## 2021-07-13 VITALS — BP 122/83 | HR 65 | Temp 98.4°F | Wt 237.6 lb

## 2021-07-13 DIAGNOSIS — H547 Unspecified visual loss: Secondary | ICD-10-CM

## 2021-07-13 DIAGNOSIS — E039 Hypothyroidism, unspecified: Secondary | ICD-10-CM | POA: Diagnosis not present

## 2021-07-13 DIAGNOSIS — F319 Bipolar disorder, unspecified: Secondary | ICD-10-CM

## 2021-07-13 DIAGNOSIS — Z6838 Body mass index (BMI) 38.0-38.9, adult: Secondary | ICD-10-CM

## 2021-07-13 DIAGNOSIS — Z1211 Encounter for screening for malignant neoplasm of colon: Secondary | ICD-10-CM

## 2021-07-13 DIAGNOSIS — Z0001 Encounter for general adult medical examination with abnormal findings: Secondary | ICD-10-CM | POA: Diagnosis not present

## 2021-07-13 DIAGNOSIS — E663 Overweight: Secondary | ICD-10-CM

## 2021-07-13 NOTE — Patient Instructions (Signed)
It was very nice to see you today!  We will place a referral for you to see an ophthalmologist.  We will also order Cologuard.  I will see back in 6 months.  Please come back to see me sooner if needed.  Take care, Dr Jerline Pain  PLEASE NOTE:  If you had any lab tests please let us know if you have not heard back within a few days. You may see your results on mychart before we have a chance to review them but we will give you a call once they are reviewed by [place]. If we ordered any referrals today, please let us know if you have not heard from their office within the next week.   Please try these tips to maintain a healthy lifestyle:  Eat at least 3 REAL meals and 1-2 snacks per day.  Aim for no more than 5 hours between eating.  If you eat breakfast, please do so within one hour of getting up.   Each meal should contain half fruits/vegetables, one quarter protein, and one quarter carbs (no bigger than a computer mouse)  Cut down on sweet beverages. This includes juice, soda, and sweet tea.   Drink at least 1 glass of water with each meal and aim for at least 8 glasses per day  Exercise at least 150 minutes every week.    Preventive Care 106-48 Years Old, Female Preventive care refers to lifestyle choices and visits with your health care provider that can promote health and wellness. This includes: A yearly physical exam. This is also called an annual wellness visit. Regular dental and eye exams. Immunizations. Screening for certain conditions. Healthy lifestyle choices, such as: Eating a healthy diet. Getting regular exercise. Not using drugs or products that contain nicotine and tobacco. Limiting alcohol use. What can I expect for my preventive care visit? Physical exam Your health care provider will check your: Height and weight. These may be used to calculate your BMI (body mass index). BMI is a measurement that tells if you are at a healthy weight. Heart rate and blood  pressure. Body temperature. Skin for abnormal spots. Counseling Your health care provider may ask you questions about your: Past medical problems. Family's medical history. Alcohol, tobacco, and drug use. Emotional well-being. Home life and relationship well-being. Sexual activity. Diet, exercise, and sleep habits. Work and work Statistician. Access to firearms. Method of birth control. Menstrual cycle. Pregnancy history. What immunizations do I need? Vaccines are usually given at various ages, according to a schedule. Your health care provider will recommend vaccines for you based on your age, medical history, and lifestyle or other factors, such as travel or where you work. What tests do I need? Blood tests Lipid and cholesterol levels. These may be checked every 5 years, or more often if you are over 13 years old. Hepatitis C test. Hepatitis B test. Screening Lung cancer screening. You may have this screening every year starting at age 76 if you have a 30-pack-year history of smoking and currently smoke or have quit within the past 15 years. Colorectal cancer screening. All adults should have this screening starting at age 32 and continuing until age 29. Your health care provider may recommend screening at age 73 if you are at increased risk. You will have tests every 1-10 years, depending on your results and the type of screening test. Diabetes screening. This is done by checking your blood sugar (glucose) after you have not eaten for a while (fasting).  You may have this done every 1-3 years. Mammogram. This may be done every 1-2 years. Talk with your health care provider about when you should start having regular mammograms. This may depend on whether you have a family history of breast cancer. BRCA-related cancer screening. This may be done if you have a family history of breast, ovarian, tubal, or peritoneal cancers. Pelvic exam and Pap test. This may be done every 3 years  starting at age 17. Starting at age 59, this may be done every 5 years if you have a Pap test in combination with an HPV test. Other tests STD (sexually transmitted disease) testing, if you are at risk. Bone density scan. This is done to screen for osteoporosis. You may have this scan if you are at high risk for osteoporosis. Talk with your health care provider about your test results, treatment options, and if necessary, the need for more tests. Follow these instructions at home: Eating and drinking  Eat a diet that includes fresh fruits and vegetables, whole grains, lean protein, and low-fat dairy products. Take vitamin and mineral supplements as recommended by your health care provider. Do not drink alcohol if: Your health care provider tells you not to drink. You are pregnant, may be pregnant, or are planning to become pregnant. If you drink alcohol: Limit how much you have to 0-1 drink a day. Be aware of how much alcohol is in your drink. In the [place] one drink equals one 12 oz bottle of beer (355 mL), one 5 oz glass of wine (148 mL), or one 1 oz glass of hard liquor (44 mL). Lifestyle Take daily care of your teeth and gums. Brush your teeth every morning and night with fluoride toothpaste. Floss one time each day. Stay active. Exercise for at least 30 minutes 5 or more days each week. Do not use any products that contain nicotine or tobacco, such as cigarettes, e-cigarettes, and chewing tobacco. If you need help quitting, ask your health care provider. Do not use drugs. If you are sexually active, practice safe sex. Use a condom or other form of protection to prevent STIs (sexually transmitted infections). If you do not wish to become pregnant, use a form of birth control. If you plan to become pregnant, see your health care provider for a prepregnancy visit. If told by your health care provider, take low-dose aspirin daily starting at age 49. Find healthy ways to cope with stress,  such as: Meditation, yoga, or listening to music. Journaling. Talking to a trusted person. Spending time with friends and family. Safety Always wear your seat belt while driving or riding in a vehicle. Do not drive: If you have been drinking alcohol. Do not ride with someone who has been drinking. When you are tired or distracted. While texting. Wear a helmet and other protective equipment during sports activities. If you have firearms in your house, make sure you follow all gun safety procedures. What's next? Visit your health care provider once a year for an annual wellness visit. Ask your health care provider how often you should have your eyes and teeth checked. Stay up to date on all vaccines. This information is not intended to replace advice given to you by your health care provider. Make sure you discuss any questions you have with your health care provider. Document Revised: 12/30/2020 Document Reviewed: 07/03/2018 Elsevier Patient Education  2022 [employer].

## 2021-07-13 NOTE — Assessment & Plan Note (Signed)
Stable.  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  We will continue current regimen Lamictal 300 mg daily, hydroxyzine 50 mg daily, trazodone 50 mg nightly and Xanax as needed.  Does not need refills today.

## 2021-07-13 NOTE — Assessment & Plan Note (Signed)
We can recheck TSH with her next blood draw.  Continue Synthroid 200 mcg daily.

## 2021-07-13 NOTE — Progress Notes (Signed)
Chief Complaint:  Nancy Mills is a 50 y.o. female who presents today for her annual comprehensive physical exam.    Assessment/Plan:  Chronic Problems Addressed Today: Bipolar 1 disorder ([place]) Stable.  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  We will continue current regimen Lamictal 300 mg daily, hydroxyzine 50 mg daily, trazodone 50 mg nightly and Xanax as needed.  Does not need refills today.  Hypothyroidism We can recheck TSH with her next blood draw.  Continue Synthroid 200 mcg daily.   Body mass index is 38.35 kg/m. / Overweight  BMI Metric Follow Up - 07/13/21 1344       BMI Metric Follow Up-Please document annually   BMI Metric Follow Up Education provided              Preventative Healthcare: UTD on vaccines. Due for cologuard. We can check labs next office visit.   Patient Counseling(The following topics were reviewed and/or handout was given):  -Nutrition: Stressed importance of moderation in sodium/caffeine intake, saturated fat and cholesterol, caloric balance, sufficient intake of fresh fruits, vegetables, and fiber.  -Stressed the importance of regular exercise.   -Substance Abuse: Discussed cessation/primary prevention of tobacco, alcohol, or other drug use; driving or other dangerous activities under the influence; availability of treatment for abuse.   -Injury prevention: Discussed safety belts, safety helmets, smoke detector, smoking near bedding or upholstery.   -Sexuality: Discussed sexually transmitted diseases, partner selection, use of condoms, avoidance of unintended pregnancy and contraceptive alternatives.   -Dental health: Discussed importance of regular tooth brushing, flossing, and dental visits.  -Health maintenance and immunizations reviewed. Please refer to Health maintenance section.  Return to care in 1 year for next preventative visit.     Subjective:  HPI:  She has had a hx of left hip  pain. She was diagnosed with osteoarthritis few months ago.  She seen Dr. Aundria Mems on 03/08/2021. She state she was given injection to help with the pain. She follows with him every few months. Overall she is doing well. Tolerating her medication with no side effects.   She has no acute complaints today.   Lifestyle Diet: Balanced Exercise: Walks at least 30 minutes day.   Depression screen PHQ 2/9 04/07/2021  Decreased Interest 0  Down, Depressed, Hopeless 0  PHQ - 2 Score 0    Health Maintenance Due  Topic Date Due   Pneumococcal Vaccine 1-77 Years old (1 - PCV) Never done   URINE MICROALBUMIN  05/18/2021     ROS: Per HPI, otherwise a complete review of systems was negative.   PMH:  The following were reviewed and entered/updated in epic: Past Medical History:  Diagnosis Date   Adjustment disorder with mixed anxiety and depressed mood    Anxiety    Asthma    Bipolar 1 disorder ([place])    Depression    Diabetes mellitus without complication ([place])    HSV-2 infection    Liver cirrhosis secondary to NASH ([place])    Migraines    Osteoarthritis    Severe obesity ([place])    Thyroid disease    Patient Active Problem List   Diagnosis Date Noted   Primary osteoarthritis of left hip 01/18/2021   Pain of right great toe 08/17/2020   S/P gastric bypass 04/26/2020   Spinal stenosis of lumbar region 03/05/2019   Lumbar radiculopathy 03/05/2019   Bipolar 1 disorder ([place]) 03/27/2018   HSV-2 infection 05/21/2017   Liver cirrhosis secondary  to NASH ([place]) 05/09/2016   Portal venous hypertension ([place]) 05/09/2016   Irritable bowel syndrome with diarrhea 05/09/2016   Hypothyroidism 02/09/2016   Past Surgical History:  Procedure Laterality Date   ABDOMINAL SURGERY     abdomnal wall reconstruction     CESAREAN SECTION  2001 2004   CHOLECYSTECTOMY     RIGHT OOPHORECTOMY      Family History  Problem Relation Age of Onset   Depression Mother    Mental illness Mother     Cancer Father    Depression Father    Diabetes Father    Depression Brother    Heart attack Maternal Grandfather    Heart disease Maternal Grandfather    High blood pressure Maternal Grandfather    Stroke Maternal Grandfather    COPD Paternal Grandmother    COPD Paternal Grandfather    Heart attack Paternal Grandfather    High blood pressure Paternal Grandfather     Medications- reviewed and updated Current Outpatient Medications  Medication Sig Dispense Refill   albuterol (PROVENTIL HFA;VENTOLIN HFA) 108 (90 BASE) MCG/ACT inhaler Inhale 2 puffs into the lungs every 6 (six) hours as needed for wheezing.      ALPRAZolam (XANAX) 1 MG tablet Take 1 tablet (1 mg total) by mouth 2 (two) times daily as needed for anxiety. 180 tablet 1   azelastine (ASTELIN) 0.1 % nasal spray Place 1 spray into both nostrils 2 (two) times daily. Use in each nostril as directed 30 mL 12   folic acid (FOLVITE) 413 MCG tablet Take 400 mcg by mouth daily.     gabapentin (NEURONTIN) 600 MG tablet TAKE 2 TABLETS BY MOUTH THREE TIMES DAILY 180 tablet 3   hydrOXYzine (ATARAX/VISTARIL) 50 MG tablet Take 1 tablet (50 mg total) by mouth at bedtime as needed for anxiety. 90 tablet 0   ketoconazole (NIZORAL) 2 % cream APPLY 1 APPLICATION TOPICALLY 2 (TWO) TIMES DAILY. 60 g 0   lamoTRIgine (LAMICTAL) 200 MG tablet TAKE 1 TABLET BY MOUTH TWO TIMES DAILY 180 tablet 1   levonorgestrel (MIRENA) 20 MCG/24HR IUD 1 each by Intrauterine route once.     levothyroxine (SYNTHROID) 200 MCG tablet TAKE 1 TABLET BY MOUTH ONCE A DAY 90 tablet 3   Multiple Vitamins-Minerals (OPURITY BYPASS OPTIMIZED) CHEW Chew 1 tablet by mouth daily.      omeprazole (PRILOSEC) 20 MG capsule TAKE 1 CAPSULE BY MOUTH ONCE A DAY 90 capsule 3   ondansetron (ZOFRAN) 4 MG tablet Take 1 tablet (4 mg total) by mouth every 6 (six) hours. (Patient taking differently: Take 4 mg by mouth every 8 (eight) hours as needed for nausea.) 12 tablet 0   Probiotic Product  (PROBIOTIC-10 PO) Take 1 capsule by mouth daily.      senna-docusate (SENOKOT-S) 8.6-50 MG tablet Take 1 tablet by mouth daily.      traMADol (ULTRAM) 50 MG tablet Take 1-2 tablets (50-100 mg total) by mouth 2 (two) times daily as needed for moderate pain. Maximum 6 tabs per day. 60 tablet 2   traMADol (ULTRAM) 50 MG tablet Take 1-2 tablets by mouth twice daily as needed for moderate pain. Max 6 tabs/day. 60 tablet 1   traMADol (ULTRAM) 50 MG tablet Take 1-2 tablets by mouth twice daily as needed for moderate pain. Max 6 tabs/day. 60 tablet 1   traZODone (DESYREL) 50 MG tablet Take 50 mg by mouth at bedtime as needed for sleep.      valACYclovir (VALTREX) 500 MG tablet TAKE  2 TABLETS BY MOUTH ONCE A DAY 180 tablet 1   vitamin B-12 (CYANOCOBALAMIN) 100 MCG tablet Take 100 mcg by mouth daily.     metroNIDAZOLE (FLAGYL) 500 MG tablet TAKE 1 TABLET (500 MG TOTAL) BY MOUTH 2 (TWO) TIMES DAILY FOR 7 DAYS. (Patient not taking: Reported on 07/13/2021) 14 tablet 0   omeprazole (PRILOSEC) 20 MG capsule TAKE 1 CAPSULE BY MOUTH DAILY. 90 capsule 1   scopolamine (TRANSDERM-SCOP) 1 MG/3DAYS Place 1 patch (1.5 mg total) onto the skin every 3 (three) days. (Patient not taking: Reported on 07/13/2021) 4 patch 0   No current facility-administered medications for this visit.    Allergies-reviewed and updated Allergies  Allergen Reactions   Other Anaphylaxis    Covi-19 Vaccine (Pfiser)   Clindamycin/Lincomycin Diarrhea    Severe diarrhea, cannot tolerate   Latex Rash   Nsaids     Due to history of gastric bypass   Erythromycin    Penicillins    Chicken Allergy Other (See Comments)    Gastric bypass surgery, can not digest    Codeine    Morphine And Related Nausea And Vomiting    Social History   Socioeconomic History   Marital status: Legally Separated    Spouse name: Not on file   Number of children: Not on file   Years of education: Not on file   Highest education level: Not on file  Occupational  History   Not on file  Tobacco Use   Smoking status: Every Day    Packs/day: 0.50    Years: 20.00    Pack years: 10.00    Types: Cigarettes   Smokeless tobacco: Never  Substance and Sexual Activity   Alcohol use: Yes   Drug use: No   Sexual activity: Yes    Partners: Male    Birth control/protection: I.U.D.  Other Topics Concern   Not on file  Social History Narrative   Not on file   Social Determinants of Health   Financial Resource Strain: Not on file  Food Insecurity: Not on file  Transportation Needs: Not on file  Physical Activity: Not on file  Stress: Not on file  Social Connections: Not on file        Objective:  Physical Exam: BP 122/83   Pulse 65   Temp 98.4 F (36.9 C) (Temporal)   Wt 237 lb 9.6 oz (107.8 kg)   SpO2 98%   BMI 38.35 kg/m   Body mass index is 38.35 kg/m. Wt Readings from Last 3 Encounters:  07/13/21 237 lb 9.6 oz (107.8 kg)  01/19/21 252 lb (114.3 kg)  01/09/21 249 lb (112.9 kg)   Gen: NAD, resting comfortably HEENT: TMs normal bilaterally. OP clear. No thyromegaly noted.  CV: RRR with no murmurs appreciated Pulm: NWOB, CTAB with no crackles, wheezes, or rhonchi GI: Normal bowel sounds present. Soft, Nontender, Nondistended. MSK: no edema, cyanosis, or clubbing noted Skin: warm, dry Neuro: CN2-12 grossly intact. Strength 5/5 in upper and lower extremities. Reflexes symmetric and intact bilaterally.  Psych: Normal affect and thought content      I,Savera Zaman,acting as a scribe for Dimas Chyle, MD.,have documented all relevant documentation on the behalf of Dimas Chyle, MD,as directed by  Dimas Chyle, MD while in the presence of Dimas Chyle, MD.   I, Dimas Chyle, MD, have reviewed all documentation for this visit. The documentation on 07/13/21 for the exam, diagnosis, procedures, and orders are all accurate and complete.  Algis Greenhouse. Jerline Pain, MD  07/13/2021 2:00 PM

## 2021-07-27 ENCOUNTER — Ambulatory Visit: Payer: No Typology Code available for payment source | Admitting: Obstetrics and Gynecology

## 2021-08-01 ENCOUNTER — Other Ambulatory Visit: Payer: Self-pay

## 2021-08-01 ENCOUNTER — Ambulatory Visit (INDEPENDENT_AMBULATORY_CARE_PROVIDER_SITE_OTHER): Payer: No Typology Code available for payment source

## 2021-08-01 VITALS — BP 120/73 | HR 67 | Ht 66.0 in | Wt 236.0 lb

## 2021-08-01 DIAGNOSIS — Z01419 Encounter for gynecological examination (general) (routine) without abnormal findings: Secondary | ICD-10-CM | POA: Diagnosis not present

## 2021-08-01 DIAGNOSIS — Z1231 Encounter for screening mammogram for malignant neoplasm of breast: Secondary | ICD-10-CM | POA: Diagnosis not present

## 2021-08-01 DIAGNOSIS — Z139 Encounter for screening, unspecified: Secondary | ICD-10-CM

## 2021-08-01 DIAGNOSIS — Z0000 Encounter for general adult medical examination without abnormal findings: Secondary | ICD-10-CM

## 2021-08-01 NOTE — Progress Notes (Signed)
   Subjective:     Nancy Mills is a 50 y.o. female here at [facility] for a routine exam.  Current complaints: feeling hot and sweaty intermittently for the past few months at night. No vaginal dryness, irritation, or other concerning symptoms. Has Mirena. Personal health questionnaire reviewed: yes.  Do you have a primary care provider? Yes, Dr. Jerline Pain Do you feel safe at home? yes  Flowsheet Row Video Visit from 04/07/2021 in [facility]  PHQ-2 Total Score 0       Health Maintenance Due  Topic Date Due   URINE MICROALBUMIN  05/18/2021     Risk factors for chronic health problems: Smoking: yes, 0.25 pack/day Alchohol/how much: occasional Illicit drug use: no Exercise: yes, walks most days of the week Pt BMI: Body mass index is 38.09 kg/m.   Gynecologic History No LMP recorded. (Menstrual status: IUD). Contraception: IUD Sexual health: no issues Last Pap: 05/18/2020. Results were: normal Last mammogram: 06/29/2020. Results were: abnormal, distortion of left breast. Had follow up with no evidence of malignancy  Obstetric History OB History  Gravida Para Term Preterm AB Living  2 2       2   SAB IAB Ectopic Multiple Live Births               # Outcome Date GA Lbr Len/2nd Weight Sex Delivery Anes PTL Lv  2 Para           1 Para             The following portions of the patient's history were reviewed and updated as appropriate: allergies, current medications, past family history, past medical history, past social history, past surgical history, and problem list.  Review of Systems Pertinent items are noted in HPI.    Objective:   BP 120/73   Pulse 67   Ht 5' 6"  (1.676 m)   Wt 236 lb (107 kg)   BMI 38.09 kg/m  VS reviewed, nursing note reviewed,  Constitutional: well developed, well nourished, no distress HEENT: normocephalic CV: normal rate Pulm/chest wall: normal effort Breast Exam:  Performed: right  breast normal without mass, skin or nipple changes or axillary nodes, left breast normal without mass, skin or nipple changes or axillary nodes Abdomen: soft Neuro: alert and oriented x 3 Skin: warm, dry Psych: affect normal Pelvic exam: Deferred Bimanual exam: Deferred      Assessment/Plan:   1. Screening due  - MM Digital Screening; Future  2. Well woman exam (no gynecological exam) - Routine exam - Night sweats/hot flashes may be attributed to perimenopause; reviewed ways to decrease night sweats/hot flashes, information provided - May contact office if symptoms worsen   Follow up in: 1  year  or sooner as needed.   Renee Harder, CNM 08/01/21 1:14 PM

## 2021-08-07 ENCOUNTER — Encounter: Payer: Self-pay | Admitting: Family Medicine

## 2021-08-10 ENCOUNTER — Encounter: Payer: Self-pay | Admitting: Medical-Surgical

## 2021-08-10 ENCOUNTER — Ambulatory Visit (INDEPENDENT_AMBULATORY_CARE_PROVIDER_SITE_OTHER): Payer: No Typology Code available for payment source | Admitting: Medical-Surgical

## 2021-08-10 ENCOUNTER — Other Ambulatory Visit (HOSPITAL_BASED_OUTPATIENT_CLINIC_OR_DEPARTMENT_OTHER): Payer: Self-pay

## 2021-08-10 VITALS — BP 112/72 | HR 85 | Resp 20 | Ht 66.0 in | Wt 239.0 lb

## 2021-08-10 DIAGNOSIS — J018 Other acute sinusitis: Secondary | ICD-10-CM

## 2021-08-10 MED ORDER — FLUCONAZOLE 150 MG PO TABS
150.0000 mg | ORAL_TABLET | Freq: Once | ORAL | 0 refills | Status: AC
  Filled 2021-08-10: qty 2, 2d supply, fill #0

## 2021-08-10 MED ORDER — CEFDINIR 300 MG PO CAPS
300.0000 mg | ORAL_CAPSULE | Freq: Two times a day (BID) | ORAL | 0 refills | Status: DC
  Filled 2021-08-10: qty 14, 7d supply, fill #0

## 2021-08-10 NOTE — Progress Notes (Signed)
  HPI with pertinent ROS:   CC: sinus issue  HPI: Pleasant 50 year old female presenting for evaluation of sinus congestion, left sided facial pain, left ear pain, and generalized malaise. She has been experiencing worsened allergies for the past few weeks and noted that her congestion worsened abruptly about 2 days ago. She has had a poor appetite and been extremely fatigued for the past couple of days but her facial pain and left ear pain worsened over the last 24 hours or so. She is having PND but no rhinorrhea or nasal discharge. Did wake up with green crusted discharge at the corner of her left eye this morning. Has tried Mucinex and Claritin without relief. Has been cold for a couple of days but no documented fevers. Mild cough, nonproductive. No chest congestion, nausea, vomiting, or diarrhea. Has COVID tested with negative result.   I reviewed the past medical history, family history, social history, surgical history, and allergies today and no changes were needed.  Please see the problem list section below in epic for further details.   Physical exam:   General: Well Developed, well nourished, and in no acute distress.  Neuro: Alert and oriented x3.  HEENT: Normocephalic, atraumatic, pupils equal round reactive to light, neck supple, no masses, + left anterior cervical lymphadenopathy, thyroid nonpalpable. Left external ear canal erythematous with mild bulging of the TM. Tenderness to palpation of the left frontal and  maxillary sinuses.  Skin: Warm and dry. Cardiac: Regular rate and rhythm.  Respiratory: Not using accessory muscles, speaking in full sentences.  Impression and Recommendations:    1. Acute non-recurrent sinusitis of other sinus Cefdinir 380m BID x 7 days.  Diflucan for VVC prophylaxis.  Continue supportive care.   Return if symptoms worsen or fail to improve. ___________________________________________ JClearnce Sorrel DNP, APRN, FNP-BC Primary Care and [place]

## 2021-08-15 ENCOUNTER — Other Ambulatory Visit: Payer: Self-pay

## 2021-08-15 DIAGNOSIS — Z139 Encounter for screening, unspecified: Secondary | ICD-10-CM

## 2021-08-29 ENCOUNTER — Other Ambulatory Visit: Payer: Self-pay | Admitting: Family Medicine

## 2021-08-29 ENCOUNTER — Other Ambulatory Visit: Payer: Self-pay | Admitting: Sports Medicine

## 2021-08-30 ENCOUNTER — Other Ambulatory Visit (HOSPITAL_COMMUNITY): Payer: Self-pay

## 2021-08-30 ENCOUNTER — Other Ambulatory Visit (HOSPITAL_BASED_OUTPATIENT_CLINIC_OR_DEPARTMENT_OTHER): Payer: Self-pay

## 2021-08-30 MED ORDER — TRAMADOL HCL 50 MG PO TABS
ORAL_TABLET | ORAL | 1 refills | Status: DC
  Filled 2021-08-30: qty 60, 10d supply, fill #0
  Filled 2021-11-07: qty 60, 10d supply, fill #1

## 2021-08-30 MED ORDER — HYDROXYZINE HCL 50 MG PO TABS
50.0000 mg | ORAL_TABLET | Freq: Every evening | ORAL | 0 refills | Status: DC | PRN
Start: 2021-08-30 — End: 2021-11-28
  Filled 2021-08-30: qty 90, 90d supply, fill #0

## 2021-09-13 ENCOUNTER — Ambulatory Visit (INDEPENDENT_AMBULATORY_CARE_PROVIDER_SITE_OTHER): Payer: No Typology Code available for payment source

## 2021-09-13 ENCOUNTER — Other Ambulatory Visit: Payer: Self-pay

## 2021-09-13 ENCOUNTER — Ambulatory Visit (INDEPENDENT_AMBULATORY_CARE_PROVIDER_SITE_OTHER): Payer: No Typology Code available for payment source | Admitting: Sports Medicine

## 2021-09-13 DIAGNOSIS — M1612 Unilateral primary osteoarthritis, left hip: Secondary | ICD-10-CM | POA: Diagnosis not present

## 2021-09-13 NOTE — Progress Notes (Signed)
    Procedures performed today:    Procedure: Real-time Ultrasound Guided injection of the left hip joint Device: Samsung HS60  Verbal informed consent obtained.  Time-out conducted.  Noted no overlying erythema, induration, or other signs of local infection.  Skin prepped in a sterile fashion.  Local anesthesia: Topical Ethyl chloride.  With sterile technique and under real time ultrasound guidance: Noted arthritic joint, I also noted a defect in the cortex somewhat distal to the hip joint near potentially the lesser trochanter, 1 cc Kenalog 40, 2 cc lidocaine, 2 cc bupivacaine injected easily Completed without difficulty  Advised to call if fevers/chills, erythema, induration, drainage, or persistent bleeding.  Images permanently stored and available for review in PACS.  Impression: Technically successful ultrasound guided injection.  Independent interpretation of notes and tests performed by another provider:   None.  Brief History, Exam, Impression, and Recommendations:    Primary osteoarthritis of left hip This is a pleasant 50 year old female, long history of left hip pain, confirmed osteoarthritis on x-rays, we have been unable to use NSAIDs due to prior gastric bypass surgery, we tried tramadol, rehab, none of these worked sufficiently so I injected her hip back in April of this year, she had good relief. Recurrence of pain, repeat left hip joint injection today. There did appear to be a defect of the cortex distal to the joint, we will go ahead and get an x-ray to ensure were not dealing with a stress injury here. Return to see me as needed.    ___________________________________________ Gwen Her. Dianah Field, M.D., ABFM., CAQSM. Primary Care and [place] Instructor of [facility]

## 2021-09-13 NOTE — Assessment & Plan Note (Addendum)
This is a pleasant 50 year old female, long history of left hip pain, confirmed osteoarthritis on x-rays, we have been unable to use NSAIDs due to prior gastric bypass surgery, we tried tramadol, rehab, none of these worked sufficiently so I injected her hip back in April of this year, she had good relief. Recurrence of pain, repeat left hip joint injection today. There did appear to be a defect of the cortex distal to the joint, we will go ahead and get an x-ray to ensure were not dealing with a stress injury here. Return to see me as needed.

## 2021-09-18 ENCOUNTER — Other Ambulatory Visit: Payer: Self-pay

## 2021-09-18 ENCOUNTER — Other Ambulatory Visit (HOSPITAL_BASED_OUTPATIENT_CLINIC_OR_DEPARTMENT_OTHER): Payer: Self-pay

## 2021-09-18 ENCOUNTER — Ambulatory Visit (INDEPENDENT_AMBULATORY_CARE_PROVIDER_SITE_OTHER): Payer: No Typology Code available for payment source | Admitting: Physician Assistant

## 2021-09-18 ENCOUNTER — Encounter: Payer: Self-pay | Admitting: Physician Assistant

## 2021-09-18 VITALS — BP 109/69 | HR 70 | Temp 97.7°F | Ht 66.0 in | Wt 240.0 lb

## 2021-09-18 DIAGNOSIS — J40 Bronchitis, not specified as acute or chronic: Secondary | ICD-10-CM | POA: Diagnosis not present

## 2021-09-18 DIAGNOSIS — J329 Chronic sinusitis, unspecified: Secondary | ICD-10-CM | POA: Diagnosis not present

## 2021-09-18 MED ORDER — AZITHROMYCIN 250 MG PO TABS
ORAL_TABLET | ORAL | 0 refills | Status: DC
  Filled 2021-09-18: qty 6, 5d supply, fill #0

## 2021-09-18 MED ORDER — PREDNISONE 50 MG PO TABS
ORAL_TABLET | ORAL | 0 refills | Status: DC
Start: 2021-09-18 — End: 2021-11-10
  Filled 2021-09-18: qty 5, 5d supply, fill #0

## 2021-09-18 NOTE — Progress Notes (Signed)
Subjective:    Patient ID: Nancy Mills, female    DOB: 1971/03/08, 50 y.o.   MRN: [id number]  HPI Pt is a 50 yo female with 6 days of sinus pressure, right ear pain, cough, congestion, SOB. Her cough is productive. Negative for covid, flu, strep. No fever or chills or body aches. Taking mucinex, flonase, vicks vapor rub with little benefit. Coughing up yellow mucus. Pt is a smoker.    .. Active Ambulatory Problems    Diagnosis Date Noted   Liver cirrhosis secondary to NASH ([place]) 05/09/2016   Portal venous hypertension ([place]) 05/09/2016   HSV-2 infection 05/21/2017   Hypothyroidism 02/09/2016   Irritable bowel syndrome with diarrhea 05/09/2016   Spinal stenosis of lumbar region 03/05/2019   Lumbar radiculopathy 03/05/2019   Bipolar 1 disorder ([place]) 03/27/2018   S/P gastric bypass 04/26/2020   Pain of right great toe 08/17/2020   Primary osteoarthritis of left hip 01/18/2021   Resolved Ambulatory Problems    Diagnosis Date Noted   Multinodular thyroid 02/09/2016   Left facial swelling 01/20/2020   Rectal leakage 12/03/2018   Acute left-sided low back pain without sciatica 03/03/2020   Episode of dizziness 08/08/2020   Periapical abscess 11/03/2020   Past Medical History:  Diagnosis Date   Adjustment disorder with mixed anxiety and depressed mood    Anxiety    Asthma    Depression    Diabetes mellitus without complication ([place])    Migraines    Osteoarthritis    Severe obesity ([place])    Thyroid disease       Review of Systems See HPI.     Objective:   Physical Exam Vitals reviewed.  Constitutional:      Appearance: Normal appearance. She is obese.  HENT:     Head: Normocephalic.     Right Ear: Ear canal and external ear normal. There is no impacted cerumen.     Left Ear: Tympanic membrane, ear canal and external ear normal.     Ears:     Comments: Bulging and red right TM.    Nose: Congestion present.  Eyes:     Conjunctiva/sclera: Conjunctivae  normal.  Neck:     Vascular: No carotid bruit.  Cardiovascular:     Rate and Rhythm: Normal rate and regular rhythm.     Pulses: Normal pulses.     Heart sounds: Normal heart sounds.  Pulmonary:     Effort: Pulmonary effort is normal.     Breath sounds: Normal breath sounds.  Musculoskeletal:     Cervical back: Normal range of motion and neck supple.  Lymphadenopathy:     Cervical: Cervical adenopathy present.  Neurological:     General: No focal deficit present.     Mental Status: She is alert and oriented to person, place, and time.  Psychiatric:        Mood and Affect: Mood normal.          Assessment & Plan:  Marland KitchenMarland KitchenAracelli was seen today for cough.  Diagnoses and all orders for this visit:  Sinobronchitis -     azithromycin (ZITHROMAX Z-PAK) 250 MG tablet; Take 2 tablets (500 mg) by mouth on day 1,  followed by 1 tablet (250 mg) once daily on days 2 through 5. -     predniSONE (DELTASONE) 50 MG tablet; Take one tablet by mouth daily for 5 days.  Covid/flu/strep negative.  1 week of symptoms.  Sent zpak and prednisone.  Encouraged smoking cessation.  Continue flonase.  Follow up as needed or if symptoms worsen.

## 2021-09-18 NOTE — Patient Instructions (Signed)

## 2021-09-21 ENCOUNTER — Ambulatory Visit
Admission: RE | Admit: 2021-09-21 | Discharge: 2021-09-21 | Disposition: A | Discharge status: Home / Self Care | Payer: No Typology Code available for payment source | Source: Ambulatory Visit

## 2021-09-21 ENCOUNTER — Other Ambulatory Visit: Payer: Self-pay

## 2021-09-21 DIAGNOSIS — Z139 Encounter for screening, unspecified: Secondary | ICD-10-CM

## 2021-09-21 LAB — COLOGUARD: COLOGUARD: NEGATIVE

## 2021-09-22 NOTE — Progress Notes (Signed)
Please inform patient of the following:  Cologuard is negative. We can recheck in 3 years.  Nancy Mills. Jerline Pain, MD 09/22/2021 7:57 AM

## 2021-09-27 ENCOUNTER — Other Ambulatory Visit: Payer: Self-pay | Admitting: Family Medicine

## 2021-09-27 MED FILL — Omeprazole Cap Delayed Release 20 MG: ORAL | 90 days supply | Qty: 90 | Fill #1 | Status: CN

## 2021-09-29 ENCOUNTER — Other Ambulatory Visit (HOSPITAL_COMMUNITY): Payer: Self-pay

## 2021-10-02 ENCOUNTER — Other Ambulatory Visit (HOSPITAL_COMMUNITY): Payer: Self-pay

## 2021-10-02 MED ORDER — LEVOTHYROXINE SODIUM 200 MCG PO TABS
ORAL_TABLET | Freq: Every day | ORAL | 3 refills | Status: DC
  Filled 2021-10-02: qty 90, 90d supply, fill #0
  Filled 2021-10-02: qty 30, 30d supply, fill #0
  Filled 2021-11-08: qty 30, 30d supply, fill #1
  Filled 2021-11-29: qty 30, 30d supply, fill #2
  Filled 2022-01-09: qty 30, 30d supply, fill #3
  Filled 2022-02-07: qty 90, 90d supply, fill #4
  Filled 2022-04-25: qty 90, 90d supply, fill #5

## 2021-10-02 MED FILL — Omeprazole Cap Delayed Release 20 MG: ORAL | 30 days supply | Qty: 30 | Fill #1 | Status: AC

## 2021-10-24 MED FILL — Lamotrigine Tab 200 MG: ORAL | 90 days supply | Qty: 180 | Fill #1 | Status: AC

## 2021-10-25 ENCOUNTER — Other Ambulatory Visit: Payer: Self-pay

## 2021-10-25 ENCOUNTER — Other Ambulatory Visit (HOSPITAL_BASED_OUTPATIENT_CLINIC_OR_DEPARTMENT_OTHER): Payer: Self-pay

## 2021-10-25 ENCOUNTER — Ambulatory Visit (INDEPENDENT_AMBULATORY_CARE_PROVIDER_SITE_OTHER): Payer: No Typology Code available for payment source | Admitting: Sports Medicine

## 2021-10-25 ENCOUNTER — Ambulatory Visit (INDEPENDENT_AMBULATORY_CARE_PROVIDER_SITE_OTHER): Payer: No Typology Code available for payment source

## 2021-10-25 ENCOUNTER — Other Ambulatory Visit (HOSPITAL_COMMUNITY): Payer: Self-pay

## 2021-10-25 ENCOUNTER — Telehealth: Payer: Self-pay | Admitting: Sports Medicine

## 2021-10-25 DIAGNOSIS — M1612 Unilateral primary osteoarthritis, left hip: Secondary | ICD-10-CM | POA: Diagnosis not present

## 2021-10-25 DIAGNOSIS — M25552 Pain in left hip: Secondary | ICD-10-CM

## 2021-10-25 MED ORDER — KETOROLAC TROMETHAMINE 60 MG/2ML IM SOLN
30.0000 mg | Freq: Once | INTRAMUSCULAR | Status: AC
Start: 2021-10-25 — End: 2021-10-25
  Administered 2021-10-25: 12:00:00 30 mg via INTRAMUSCULAR

## 2021-10-25 MED ORDER — HYDROCODONE-ACETAMINOPHEN 10-325 MG PO TABS
1.0000 | ORAL_TABLET | Freq: Three times a day (TID) | ORAL | 0 refills | Status: DC | PRN
  Filled 2021-10-25: qty 15, 5d supply, fill #0

## 2021-10-25 NOTE — Telephone Encounter (Signed)
Please work on viscosupplementation approval left hip, x-ray confirmed, failed multiple modalities including activity modification, NSAIDs contraindicated due to gastric sleeve, x-rays steroid injections.

## 2021-10-25 NOTE — Addendum Note (Signed)
Addended by: Gust Brooms on: 10/25/2021 11:39 AM   Modules accepted: Orders

## 2021-10-25 NOTE — Assessment & Plan Note (Signed)
This is a pleasant 50 year old female, she is an LPN in our office, she has a long history of left hip and groin pain, x-rays did confirm osteoarthritis. Her last injection was in November, she has done well with several injections in the past, unfortunately is having recurrence of pain now, she is having some catching, locking. I do suspect she does have a degenerative labral tear versus an intra-articular loose body/broken off osteophyte. We will get updated x-rays today, adding some hydrocodone for temporary pain relief, it is too soon for another injection. Unfortunately considering the degenerative changes in her hip we are going to get a surgical opinion for hip arthroplasty. As a [facility] play we are going to try to get viscosupplementation approved for her hip though she does understand the decreased likelihood that insurance will cover it for hip joint.

## 2021-10-25 NOTE — Progress Notes (Signed)
° ° °  Procedures performed today:    None.  Independent interpretation of notes and tests performed by another provider:   None.  Brief History, Exam, Impression, and Recommendations:    Primary osteoarthritis of left hip This is a pleasant 50 year old female, she is an LPN in our office, she has a long history of left hip and groin pain, x-rays did confirm osteoarthritis. Her last injection was in November, she has done well with several injections in the past, unfortunately is having recurrence of pain now, she is having some catching, locking. I do suspect she does have a degenerative labral tear versus an intra-articular loose body/broken off osteophyte. We will get updated x-rays today, adding some hydrocodone for temporary pain relief, it is too soon for another injection. Unfortunately considering the degenerative changes in her hip we are going to get a surgical opinion for hip arthroplasty. As a [facility] play we are going to try to get viscosupplementation approved for her hip though she does understand the decreased likelihood that insurance will cover it for hip joint.  Chronic process with exacerbation and pharmacologic intervention  ___________________________________________ Gwen Her. Dianah Field, M.D., ABFM., CAQSM. Primary Care and [place] Instructor of [place] [facility] of Medicine

## 2021-10-26 ENCOUNTER — Other Ambulatory Visit (HOSPITAL_COMMUNITY): Payer: Self-pay

## 2021-10-26 MED FILL — Omeprazole Cap Delayed Release 20 MG: ORAL | 30 days supply | Qty: 30 | Fill #2 | Status: AC

## 2021-11-08 ENCOUNTER — Other Ambulatory Visit (HOSPITAL_BASED_OUTPATIENT_CLINIC_OR_DEPARTMENT_OTHER): Payer: Self-pay

## 2021-11-08 ENCOUNTER — Other Ambulatory Visit (HOSPITAL_COMMUNITY): Payer: Self-pay

## 2021-11-10 ENCOUNTER — Ambulatory Visit (INDEPENDENT_AMBULATORY_CARE_PROVIDER_SITE_OTHER): Payer: No Typology Code available for payment source | Admitting: Physician Assistant

## 2021-11-10 ENCOUNTER — Encounter: Payer: Self-pay | Admitting: Physician Assistant

## 2021-11-10 VITALS — BP 127/73 | HR 87 | Temp 98.0°F

## 2021-11-10 DIAGNOSIS — J209 Acute bronchitis, unspecified: Secondary | ICD-10-CM

## 2021-11-10 MED ORDER — PREDNISONE 50 MG PO TABS: ORAL_TABLET | ORAL | 0 refills | Status: DC

## 2021-11-10 NOTE — Progress Notes (Signed)
° °  Subjective:    Patient ID: Tumeka Chimenti, female    DOB: 1971/10/01, 51 y.o.   MRN: [id number]  HPI Patient is a 51 year old obese female who is a current smoker who presents to the clinic with productive cough for 4 days.  She is also having some upper respiratory symptoms and congestion.  She denies any fever, chills, body aches.  She was around her boyfriend who had some upper respiratory symptoms.  She tested negative for COVID and flu today.  She is not having any significant shortness of breath.  She is taking Mucinex with some relief. Current smoker  .Marland Kitchen Active Ambulatory Problems    Diagnosis Date Noted   Liver cirrhosis secondary to NASH ([place]) 05/09/2016   Portal venous hypertension ([place]) 05/09/2016   HSV-2 infection 05/21/2017   Hypothyroidism 02/09/2016   Irritable bowel syndrome with diarrhea 05/09/2016   Spinal stenosis of lumbar region 03/05/2019   Lumbar radiculopathy 03/05/2019   Bipolar 1 disorder ([place]) 03/27/2018   S/P gastric bypass 04/26/2020   Pain of right great toe 08/17/2020   Primary osteoarthritis of left hip 01/18/2021   Resolved Ambulatory Problems    Diagnosis Date Noted   Multinodular thyroid 02/09/2016   Left facial swelling 01/20/2020   Rectal leakage 12/03/2018   Acute left-sided low back pain without sciatica 03/03/2020   Episode of dizziness 08/08/2020   Periapical abscess 11/03/2020   Past Medical History:  Diagnosis Date   Adjustment disorder with mixed anxiety and depressed mood    Anxiety    Asthma    Depression    Diabetes mellitus without complication ([place])    Migraines    Osteoarthritis    Severe obesity ([place])    Thyroid disease     Review of Systems See HPI.     Objective:   Physical Exam Vitals reviewed.  Constitutional:      Appearance: Normal appearance. She is obese.  HENT:     Head: Normocephalic.     Nose: Nose normal. No congestion or rhinorrhea.     Mouth/Throat:     Mouth: Mucous membranes are moist.      Pharynx: Posterior oropharyngeal erythema present.  Eyes:     Conjunctiva/sclera: Conjunctivae normal.  Cardiovascular:     Rate and Rhythm: Normal rate and regular rhythm.     Pulses: Normal pulses.  Pulmonary:     Effort: Pulmonary effort is normal.     Breath sounds: Normal breath sounds.  Musculoskeletal:     Cervical back: No tenderness.  Lymphadenopathy:     Cervical: No cervical adenopathy.  Neurological:     Mental Status: She is alert.  Psychiatric:        Mood and Affect: Mood normal.          Assessment & Plan:  Marland KitchenMarland KitchenBrekyn was seen today for nasal congestion.  Diagnoses and all orders for this visit:  Acute bronchitis, unspecified organism -     predniSONE (DELTASONE) 50 MG tablet; Take one tablet by mouth daily for 5 days.   Pt has a viral infection with bronchitis Use albuterol inhaler every 2-4 hours Start prednisone burst Ok to use OTC delsym/cough drops Continue mucinex Add flonase Rest and hydrate Follow up as needed or if symptoms worsen.

## 2021-11-10 NOTE — Patient Instructions (Signed)
Acute Bronchitis, Adult °Acute bronchitis is sudden inflammation of the main airways (bronchi) that come off the windpipe (trachea) in the lungs. The swelling causes the airways to get smaller and make more mucus than normal. This can make it hard to breathe and can cause coughing or noisy breathing (wheezing). °Acute bronchitis may last several weeks. The cough may last longer. Allergies, asthma, and exposure to smoke may make the condition worse. °What are the causes? °This condition can be caused by germs and by substances that irritate the lungs, including: °Cold and flu viruses. The most common cause of this condition is the virus that causes the common cold. °Bacteria. This is less common. °Breathing in substances that irritate the lungs, including: °Smoke from cigarettes and other forms of tobacco. °Dust and pollen. °Fumes from household cleaning products, gases, or burned fuel. °Indoor or outdoor air pollution. °What increases the risk? °The following factors may make you more likely to develop this condition: °A weak body's defense system, also called the immune system. °A condition that affects your lungs and breathing, such as asthma. °What are the signs or symptoms? °Common symptoms of this condition include: °Coughing. This may bring up clear, yellow, or green mucus from your lungs (sputum). °Wheezing. °Runny or stuffy nose. °Having too much mucus in your lungs (chest congestion). °Shortness of breath. °Aches and pains, including sore throat or chest. °How is this diagnosed? °This condition is usually diagnosed based on: °Your symptoms and medical history. °A physical exam. °You may also have other tests, including tests to rule out other conditions, such as pneumonia. These tests include: °A test of lung function. °Test of a mucus sample to look for the presence of bacteria. °Tests to check the oxygen level in your blood. °Blood tests. °Chest X-ray. °How is this treated? °Most cases of acute bronchitis  clear up over time without treatment. Your health care provider may recommend: °Drinking more fluids to help thin your mucus so it is easier to cough up. °Taking inhaled medicine (inhaler) to improve air flow in and out of your lungs. °Using a vaporizer or a humidifier. These are machines that add water to the air to help you breathe better. °Taking a medicine that thins mucus and clears congestion (expectorant). °Taking a medicine that prevents or stops coughing (cough suppressant). °It is notcommon to take an antibiotic medicine for this condition. °Follow these instructions at home: ° °Take over-the-counter and prescription medicines only as told by your health care provider. °Use an inhaler, vaporizer, or humidifier as told by your health care provider. °Take two teaspoons (10 mL) of honey at bedtime to lessen coughing at night. °Drink enough fluid to keep your urine pale yellow. °Do not use any products that contain nicotine or tobacco. These products include cigarettes, chewing tobacco, and vaping devices, such as e-cigarettes. If you need help quitting, ask your health care provider. °Get plenty of rest. °Return to your normal activities as told by your health care provider. Ask your health care provider what activities are safe for you. °Keep all follow-up visits. This is important. °How is this prevented? °To lower your risk of getting this condition again: °Wash your hands often with soap and water for at least 20 seconds. If soap and water are not available, use hand sanitizer. °Avoid contact with people who have cold symptoms. °Try not to touch your mouth, nose, or eyes with your hands. °Avoid breathing in smoke or chemical fumes. Breathing smoke or chemical fumes will make your condition   worse. °Get the flu shot every year. °Contact a health care provider if: °Your symptoms do not improve after 2 weeks. °You have trouble coughing up the mucus. °Your cough keeps you awake at night. °You have a  fever. °Get help right away if you: °Cough up blood. °Feel pain in your chest. °Have severe shortness of breath. °Faint or keep feeling like you are going to faint. °Have a severe headache. °Have a fever or chills that get worse. °These symptoms may represent a serious problem that is an emergency. Do not wait to see if the symptoms will go away. Get medical help right away. Call your local emergency services (911 in the [place] Do not drive yourself to the hospital. °Summary °Acute bronchitis is inflammation of the main airways (bronchi) that come off the windpipe (trachea) in the lungs. The swelling causes the airways to get smaller and make more mucus than normal. °Drinking more fluids can help thin your mucus so it is easier to cough up. °Take over-the-counter and prescription medicines only as told by your health care provider. °Do not use any products that contain nicotine or tobacco. These products include cigarettes, chewing tobacco, and vaping devices, such as e-cigarettes. If you need help quitting, ask your health care provider. °Contact a health care provider if your symptoms do not improve after 2 weeks. °This information is not intended to replace advice given to you by your health care provider. Make sure you discuss any questions you have with your health care provider. °Document Revised: 02/22/2021 Document Reviewed: 02/22/2021 °Elsevier Patient Education © 2022 Elsevier Inc. ° °

## 2021-11-13 ENCOUNTER — Telehealth: Payer: Self-pay | Admitting: Physician Assistant

## 2021-11-13 ENCOUNTER — Other Ambulatory Visit (HOSPITAL_BASED_OUTPATIENT_CLINIC_OR_DEPARTMENT_OTHER): Payer: Self-pay

## 2021-11-13 MED ORDER — DOXYCYCLINE HYCLATE 100 MG PO TABS
100.0000 mg | ORAL_TABLET | Freq: Two times a day (BID) | ORAL | 0 refills | Status: DC
  Filled 2021-11-13: qty 20, 10d supply, fill #0

## 2021-11-13 MED ORDER — FLUCONAZOLE 150 MG PO TABS
150.0000 mg | ORAL_TABLET | Freq: Every day | ORAL | 1 refills | Status: DC
  Filled 2021-11-13: qty 1, 1d supply, fill #0

## 2021-11-13 MED ORDER — HYDROCODONE BIT-HOMATROP MBR 5-1.5 MG/5ML PO SOLN
5.0000 mL | Freq: Three times a day (TID) | ORAL | 0 refills | Status: DC | PRN
  Filled 2021-11-13: qty 70, 5d supply, fill #0

## 2021-11-13 NOTE — Telephone Encounter (Signed)
On day 5 of prednisone. A little less chest congestion but cough keeping her up through the night and still very productive. Sent hycodan and doxycycline

## 2021-11-20 ENCOUNTER — Other Ambulatory Visit (HOSPITAL_COMMUNITY): Payer: Self-pay | Admitting: Orthopedic Surgery

## 2021-11-20 ENCOUNTER — Other Ambulatory Visit: Payer: Self-pay | Admitting: Orthopedic Surgery

## 2021-11-20 DIAGNOSIS — M25552 Pain in left hip: Secondary | ICD-10-CM

## 2021-11-22 ENCOUNTER — Other Ambulatory Visit (HOSPITAL_COMMUNITY): Payer: Self-pay

## 2021-11-22 ENCOUNTER — Other Ambulatory Visit: Payer: Self-pay | Admitting: Family Medicine

## 2021-11-22 MED ORDER — VALACYCLOVIR HCL 500 MG PO TABS
1000.0000 mg | ORAL_TABLET | Freq: Every day | ORAL | 1 refills | Status: DC
  Filled 2021-11-22: qty 180, 90d supply, fill #0
  Filled 2022-05-01: qty 180, 90d supply, fill #1

## 2021-11-22 NOTE — Telephone Encounter (Signed)
The original prescription was reordered on 02/04/2021 by [facility], [facility]. Renewing this prescription may not be appropriate.

## 2021-11-23 ENCOUNTER — Other Ambulatory Visit (HOSPITAL_COMMUNITY): Payer: Self-pay

## 2021-11-28 ENCOUNTER — Other Ambulatory Visit: Payer: Self-pay | Admitting: Family Medicine

## 2021-11-29 ENCOUNTER — Other Ambulatory Visit: Payer: Self-pay | Admitting: Family Medicine

## 2021-11-29 ENCOUNTER — Other Ambulatory Visit (HOSPITAL_COMMUNITY): Payer: Self-pay

## 2021-11-29 MED ORDER — HYDROXYZINE HCL 50 MG PO TABS
50.0000 mg | ORAL_TABLET | Freq: Every evening | ORAL | 0 refills | Status: DC | PRN
  Filled 2021-11-29: qty 90, 90d supply, fill #0

## 2021-11-29 MED ORDER — OMEPRAZOLE 20 MG PO CPDR
DELAYED_RELEASE_CAPSULE | Freq: Every day | ORAL | 3 refills | Status: DC
  Filled 2021-11-29: qty 90, 90d supply, fill #0
  Filled 2022-02-24: qty 90, 90d supply, fill #1
  Filled 2022-06-07: qty 90, 90d supply, fill #2
  Filled 2022-08-22: qty 90, 90d supply, fill #3

## 2021-11-29 NOTE — Telephone Encounter (Signed)
The original prescription was discontinued on 11/10/2021 by Donella Stade, PA-C. Renewing this prescription may not be appropriate.

## 2021-12-01 ENCOUNTER — Other Ambulatory Visit: Payer: Self-pay

## 2021-12-01 ENCOUNTER — Ambulatory Visit (HOSPITAL_COMMUNITY)
Admission: RE | Admit: 2021-12-01 | Discharge: 2021-12-01 | Disposition: A | Discharge status: Home / Self Care | Payer: PRIVATE HEALTH INSURANCE | Source: Ambulatory Visit | Attending: Orthopedic Surgery | Admitting: Orthopedic Surgery

## 2021-12-01 DIAGNOSIS — M25552 Pain in left hip: Secondary | ICD-10-CM | POA: Diagnosis not present

## 2021-12-01 MED ORDER — SODIUM CHLORIDE (PF) 0.9 % IJ SOLN
INTRAMUSCULAR | Status: AC
  Filled 2021-12-01: qty 10

## 2021-12-01 MED ORDER — GADOBUTROL 1 MMOL/ML IV SOLN
0.0500 mL | Freq: Once | INTRAVENOUS | Status: AC | PRN
  Administered 2021-12-01: 0.05 mL

## 2021-12-01 MED ORDER — LIDOCAINE HCL (PF) 1 % IJ SOLN
INTRAMUSCULAR | Status: AC
  Administered 2021-12-01: 5 mL
  Filled 2021-12-01: qty 5

## 2021-12-01 MED ORDER — IOHEXOL 300 MG/ML  SOLN
10.0000 mL | Freq: Once | INTRAMUSCULAR | Status: AC | PRN
  Administered 2021-12-01: 10 mL via INTRA_ARTICULAR

## 2021-12-01 NOTE — Procedures (Signed)
CLINICAL DATA: Left hip pain, clinically suspected labral tear EXAM: LEFT HIP INJECTION UNDER FLUOROSCOPY COMPARISON: Radiographs 10/25/2021 FLUOROSCOPY TIME: Fluoroscopy Time:  0 minutes, 54 seconds Radiation Exposure Index (if provided by the fluoroscopic device):  5.4 mGy Number of Acquired Spot Images: 1 PROCEDURE:  I discussed the risks (including hemorrhage, infection, and allergic reaction, among others), benefits, and alternatives to the procedure with the patient.  We specifically discussed the high technical likelihood of success of the procedure.  The patient understood and elected to undergo the procedure.    Standard time-out was employed.  Following sterile skin prep and local anesthetic administration consisting of 1% lidocaine, a 22 gauge needle was advanced without difficulty into the left hip joint under fluoroscopic guidance.  A total of 10 cc of a combination of 10 cc Omnipaque 300, 10 cc sterile saline, and 0.04 cc of Magnevist was injected into the joint.  The needle was subsequently removed and the skin cleansed and bandaged.  No immediate complications were observed.   IMPRESSION: Technically successful left hip injection under fluoroscopy.

## 2021-12-02 ENCOUNTER — Other Ambulatory Visit (HOSPITAL_COMMUNITY): Payer: Self-pay

## 2021-12-05 ENCOUNTER — Other Ambulatory Visit (HOSPITAL_COMMUNITY): Payer: Self-pay

## 2021-12-26 ENCOUNTER — Other Ambulatory Visit (HOSPITAL_BASED_OUTPATIENT_CLINIC_OR_DEPARTMENT_OTHER): Payer: Self-pay

## 2021-12-26 ENCOUNTER — Other Ambulatory Visit: Payer: Self-pay | Admitting: Sports Medicine

## 2021-12-26 DIAGNOSIS — M1612 Unilateral primary osteoarthritis, left hip: Secondary | ICD-10-CM

## 2021-12-26 MED ORDER — HYDROCODONE-ACETAMINOPHEN 10-325 MG PO TABS
1.0000 | ORAL_TABLET | Freq: Three times a day (TID) | ORAL | 0 refills | Status: DC | PRN
  Filled 2021-12-26: qty 30, 10d supply, fill #0

## 2021-12-27 IMAGING — DX DG LUMBAR SPINE BEND(FLEX/EXT) ONLY 2-3 V
3 series · 3 of 3 positions shown · non-contrast
Comparison: March 03, 2020.

CLINICAL DATA: Low back pain for 1 month.

EXAM:
LUMBAR SPINE FLEX AND EXTEND ONLY - 2-3 VIEW

[l-spine lat]
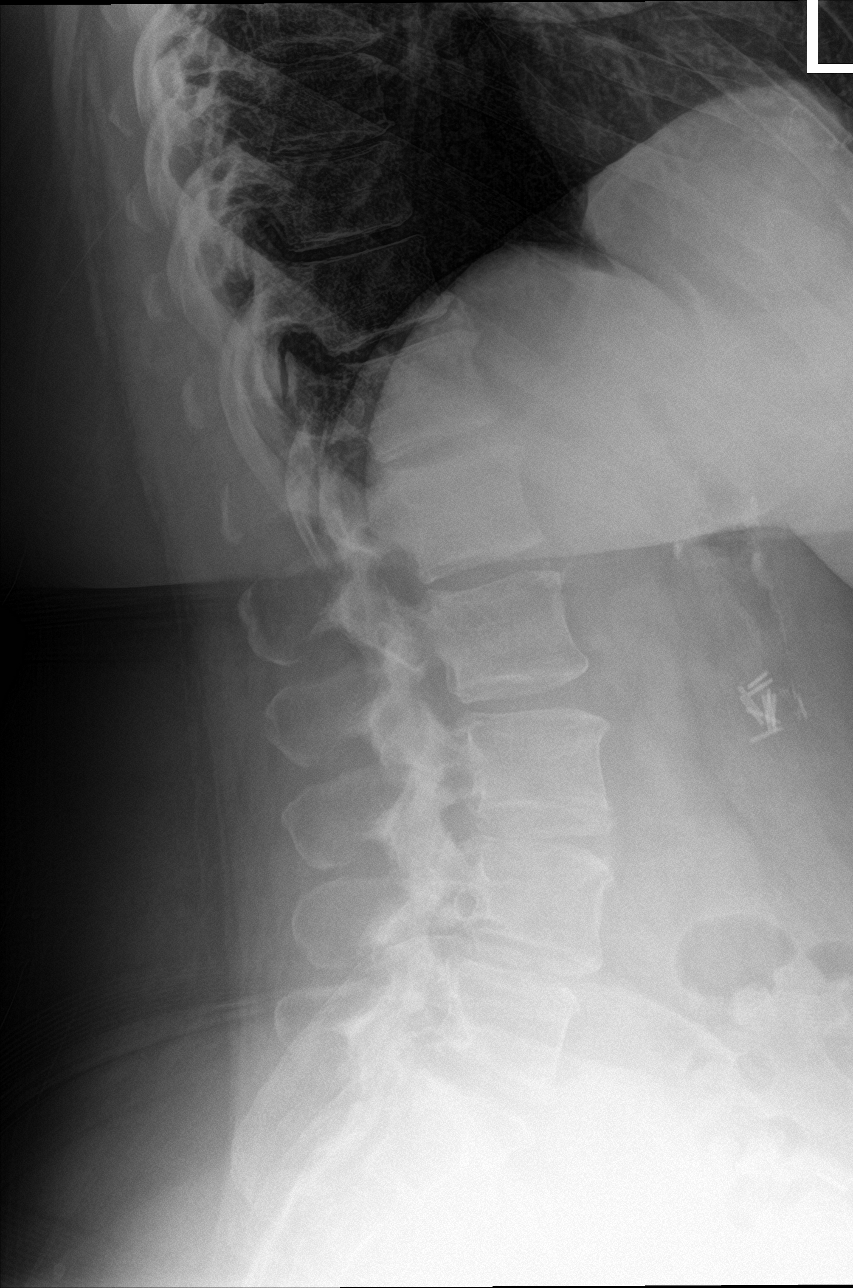

[l-spine flex]
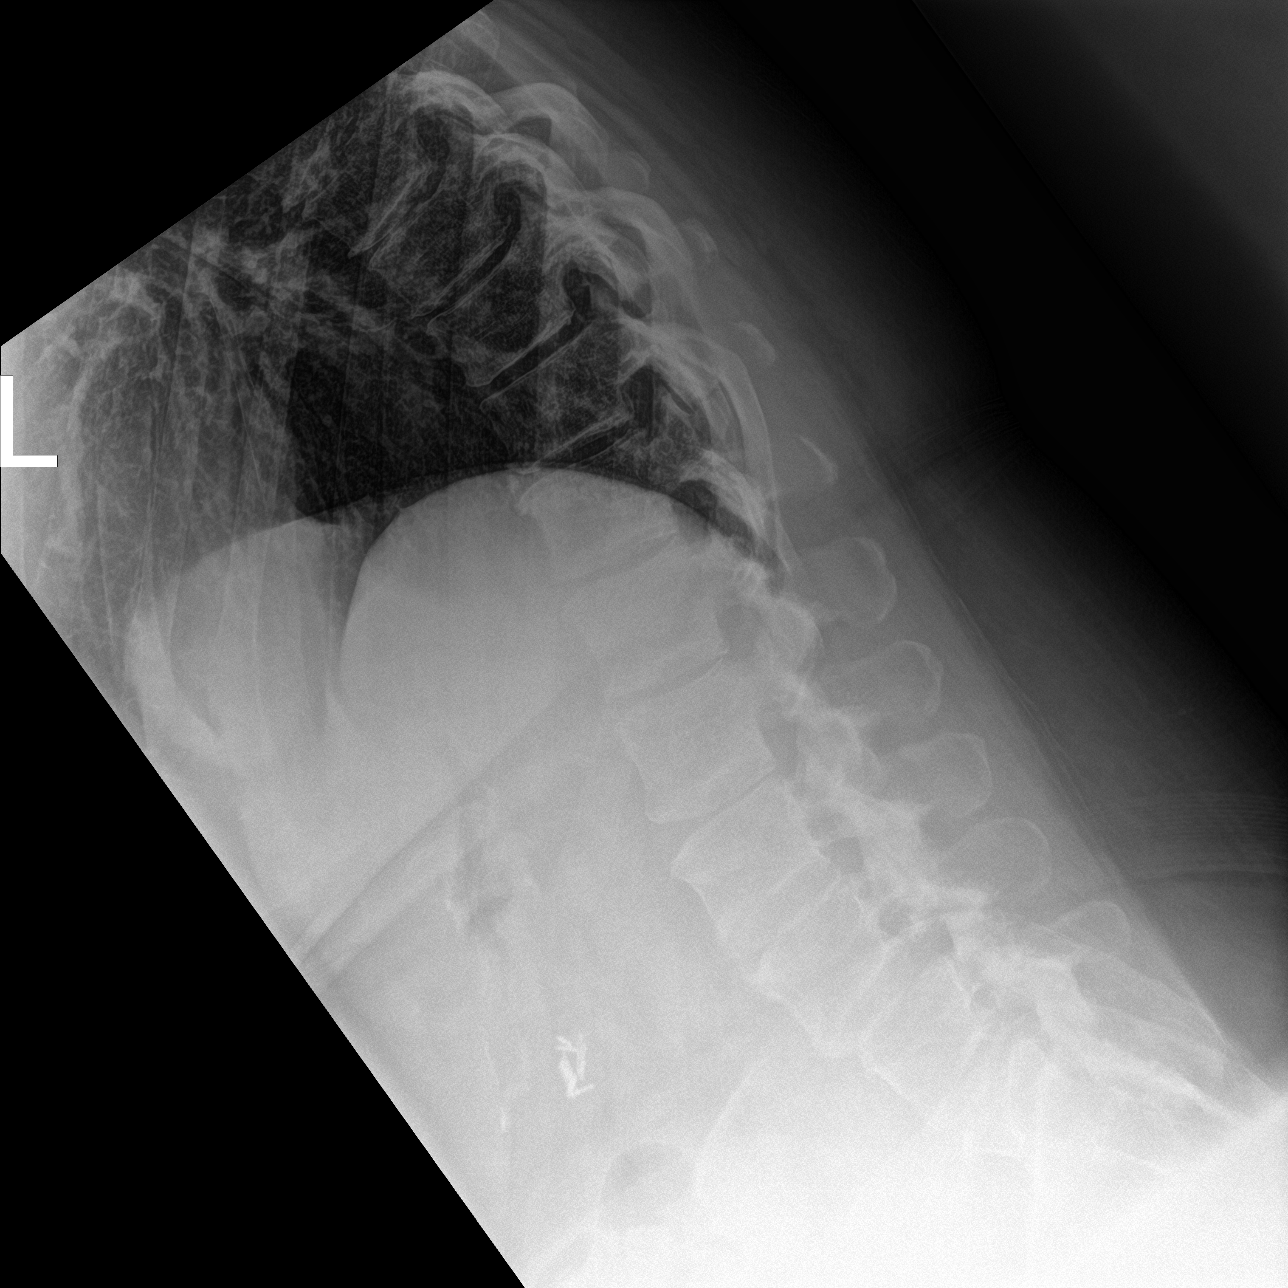

[l-spine ext]
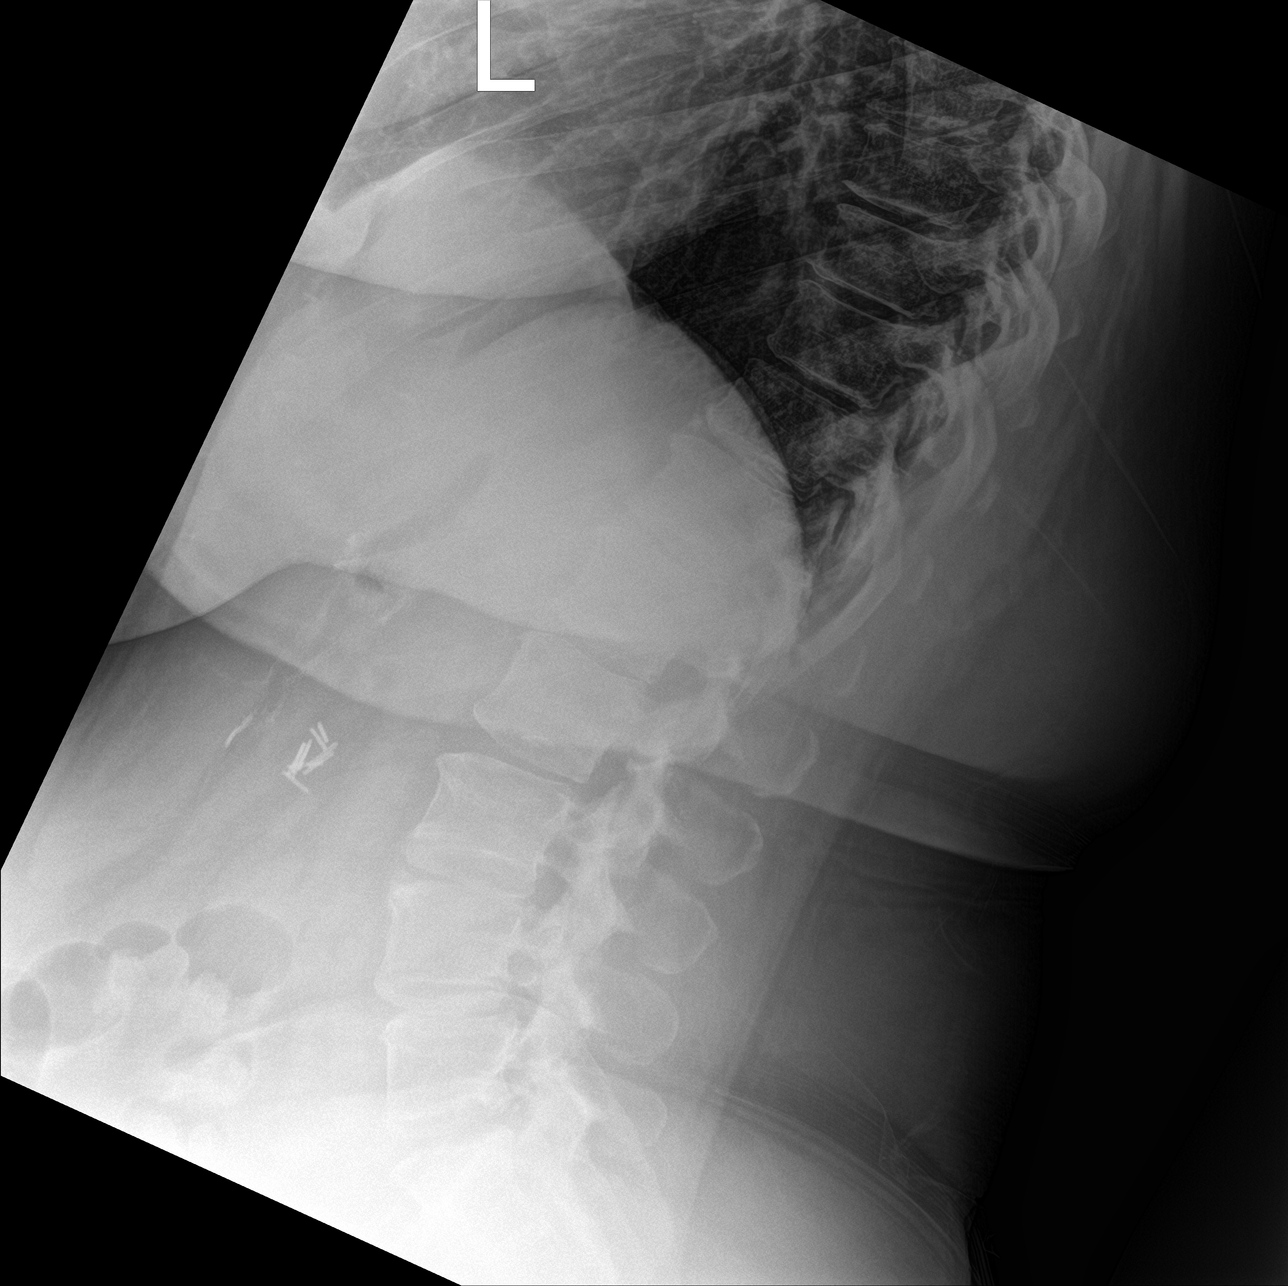

[3 of 3 positions shown; findings below may reference images not displayed]

FINDINGS: No fracture is noted. There is approximately 5 mm of anterolisthesis
of L4-5 on the flexion projection, with approximately 3 mm seen on
the extension view. Minimal degenerative disc disease is noted at
L3-4 and L4-5.
IMPRESSION: Minimal degenerative disc disease is noted at L3-4 and L4-5.
Minimally increased anterolisthesis of L4-5 is noted on the flexion
image compared to the extension image.

## 2022-01-09 ENCOUNTER — Other Ambulatory Visit (HOSPITAL_COMMUNITY): Payer: Self-pay

## 2022-01-10 ENCOUNTER — Other Ambulatory Visit (HOSPITAL_COMMUNITY): Payer: Self-pay

## 2022-01-16 ENCOUNTER — Encounter: Payer: Self-pay | Admitting: Sports Medicine

## 2022-01-16 ENCOUNTER — Other Ambulatory Visit (HOSPITAL_BASED_OUTPATIENT_CLINIC_OR_DEPARTMENT_OTHER): Payer: Self-pay

## 2022-01-16 ENCOUNTER — Other Ambulatory Visit: Payer: Self-pay | Admitting: Sports Medicine

## 2022-01-16 DIAGNOSIS — M5416 Radiculopathy, lumbar region: Secondary | ICD-10-CM

## 2022-01-16 MED ORDER — TRAMADOL HCL 50 MG PO TABS: 50.0000 mg | ORAL_TABLET | Freq: Two times a day (BID) | ORAL | 2 refills | Status: DC | PRN

## 2022-01-18 ENCOUNTER — Ambulatory Visit (INDEPENDENT_AMBULATORY_CARE_PROVIDER_SITE_OTHER): Payer: 59 | Admitting: Family Medicine

## 2022-01-18 ENCOUNTER — Other Ambulatory Visit (HOSPITAL_COMMUNITY): Payer: Self-pay

## 2022-01-18 ENCOUNTER — Encounter: Payer: Self-pay | Admitting: Family Medicine

## 2022-01-18 VITALS — BP 134/82 | HR 79 | Temp 97.6°F | Ht 66.0 in | Wt 236.8 lb

## 2022-01-18 DIAGNOSIS — E039 Hypothyroidism, unspecified: Secondary | ICD-10-CM | POA: Diagnosis not present

## 2022-01-18 DIAGNOSIS — F319 Bipolar disorder, unspecified: Secondary | ICD-10-CM

## 2022-01-18 MED ORDER — HYDROXYZINE HCL 50 MG PO TABS
50.0000 mg | ORAL_TABLET | Freq: Every evening | ORAL | 0 refills | Status: DC | PRN
  Filled 2022-01-18 – 2022-04-18 (×2): qty 90, 90d supply, fill #0

## 2022-01-18 MED ORDER — TRAZODONE HCL 50 MG PO TABS
50.0000 mg | ORAL_TABLET | Freq: Every evening | ORAL | 3 refills | Status: AC | PRN
  Filled 2022-01-18: qty 90, 90d supply, fill #0
  Filled 2022-11-23: qty 30, 30d supply, fill #1
  Filled 2022-12-26: qty 30, 30d supply, fill #2

## 2022-01-18 MED ORDER — ALPRAZOLAM 1 MG PO TABS
1.0000 mg | ORAL_TABLET | Freq: Two times a day (BID) | ORAL | 1 refills | Status: DC | PRN
  Filled 2022-01-18: qty 60, 30d supply, fill #0
  Filled 2022-04-25: qty 60, 30d supply, fill #1

## 2022-01-18 MED ORDER — LAMOTRIGINE 200 MG PO TABS
ORAL_TABLET | Freq: Two times a day (BID) | ORAL | 1 refills | Status: DC
  Filled 2022-01-18: qty 180, 90d supply, fill #0
  Filled 2022-04-25: qty 180, 90d supply, fill #1

## 2022-01-18 NOTE — Assessment & Plan Note (Signed)
Overall stable.  She is liking her new job.  We will continue current regimen Lamictal 300 mg daily, hydroxyzine 50 mg daily, trazodone 50 mg nightly, and Xanax as needed.  Refill sent in today. ?

## 2022-01-18 NOTE — Assessment & Plan Note (Signed)
Continue Synthroid 200 mcg daily.  Recheck TSH next blood draw.  She will follow-up in 6 months for CPE to have this done. ?

## 2022-01-18 NOTE — Patient Instructions (Signed)
It was very nice to see you today! ? ?We will refill your medications today. ? ?Come back in 6 months for your annual checkup.  We will need to check blood work at this visit.  Please come back sooner if needed. ? ?Take care, ?Dr Jerline Pain ? ?PLEASE NOTE: ? ?If you had any lab tests please let us know if you have not heard back within a few days. You may see your results on mychart before we have a chance to review them but we will give you a call once they are reviewed by [place]. If we ordered any referrals today, please let us know if you have not heard from their office within the next week.  ? ?Please try these tips to maintain a healthy lifestyle: ? ?Eat at least 3 REAL meals and 1-2 snacks per day.  Aim for no more than 5 hours between eating.  If you eat breakfast, please do so within one hour of getting up.  ? ?Each meal should contain half fruits/vegetables, one quarter protein, and one quarter carbs (no bigger than a computer mouse) ? ?Cut down on sweet beverages. This includes juice, soda, and sweet tea.  ? ?Drink at least 1 glass of water with each meal and aim for at least 8 glasses per day ? ?Exercise at least 150 minutes every week.   ?

## 2022-01-18 NOTE — Progress Notes (Signed)
? ?  Mischele Katheren Jimmerson is a 51 y.o. female who presents today for an office visit. ? ?Assessment/Plan:  ?Chronic Problems Addressed Today: ?Bipolar 1 disorder ([place]) ?Overall stable.  She is liking her new job.  We will continue current regimen Lamictal 300 mg daily, hydroxyzine 50 mg daily, trazodone 50 mg nightly, and Xanax as needed.  Refill sent in today. ? ?Hypothyroidism ?Continue Synthroid 200 mcg daily.  Recheck TSH next blood draw.  She will follow-up in 6 months for CPE to have this done. ? ?Left Hip Pain ?Continue management per orthopedics. ? ?  ?Subjective:  ?HPI: ? ?Patient here to 6 months follow up.  ? ?She has been doing well since last visit. She started a new job recently. She has lost 10 pounds since starting new job. She was diagnosed with bronchitis few months ago. She has recovered well. She has lost 10 pounds. She is taking Hydroxyzine 50 mg daily and tolerating her medication. No side effects.  ? ?She still have issue with hip pain. She is following up with Dr. Aundria Mems for this issue. She had her last injection in November. She is currently on Tramadol 50 mg 2 times daily and Norco 10-325 mcg as needed. She is tolerating her medication well. She is taking Tramadol for her back pain. She has increased back pain since starting her new job. ? ? ?   ?  ?Objective:  ?Physical Exam: ?BP 134/82 (BP Location: Right Arm)   Pulse 79   Temp 97.6 ?F (36.4 ?C) (Temporal)   Ht 5' 6"  (1.676 m)   Wt 236 lb 12.8 oz (107.4 kg)   SpO2 100%   BMI 38.22 kg/m?   ?Gen: No acute distress, resting comfortably ?Neuro: Grossly normal, moves all extremities ?Psych: Normal affect and thought content ? ?   ? ?I,Savera Zaman,acting as a Education administrator for Dimas Chyle, MD.,have documented all relevant documentation on the behalf of Dimas Chyle, MD,as directed by  Dimas Chyle, MD while in the presence of Dimas Chyle, MD.  ? ?I, Dimas Chyle, MD, have reviewed all documentation for this visit. The  documentation on 01/18/22 for the exam, diagnosis, procedures, and orders are all accurate and complete. ? ?Algis Greenhouse. Jerline Pain, MD ?01/18/2022 9:50 AM  ? ?

## 2022-02-07 ENCOUNTER — Other Ambulatory Visit (HOSPITAL_COMMUNITY): Payer: Self-pay

## 2022-02-24 ENCOUNTER — Other Ambulatory Visit (HOSPITAL_COMMUNITY): Payer: Self-pay

## 2022-03-28 IMAGING — US US ABDOMEN LIMITED
1 series · 14 of 25 positions shown · non-contrast
Comparison: 03/02/2020

CLINICAL DATA: Liver cirrhosis secondary to NASH. Previous
cholecystectomy. Chronic hepatomegaly.

EXAM:
ULTRASOUND ABDOMEN LIMITED RIGHT UPPER QUADRANT

[Series 1: us abdomen limited · 0.21mm/px · 14 of 48 slices shown]
[im 1/48]
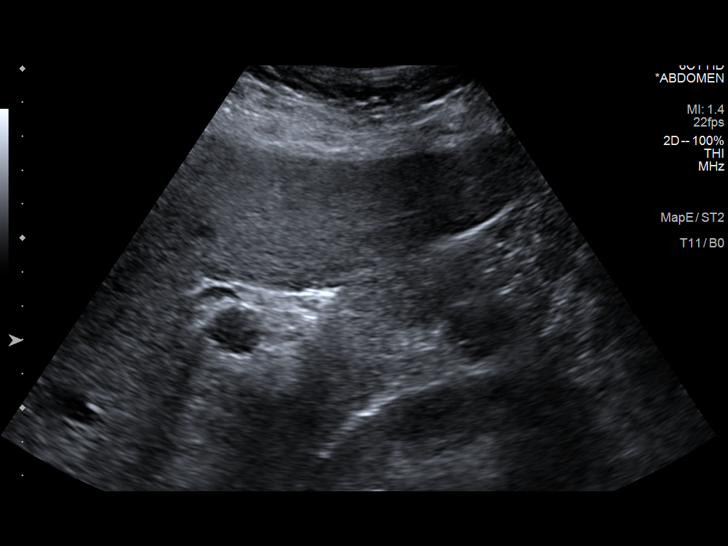
[im 4/48]
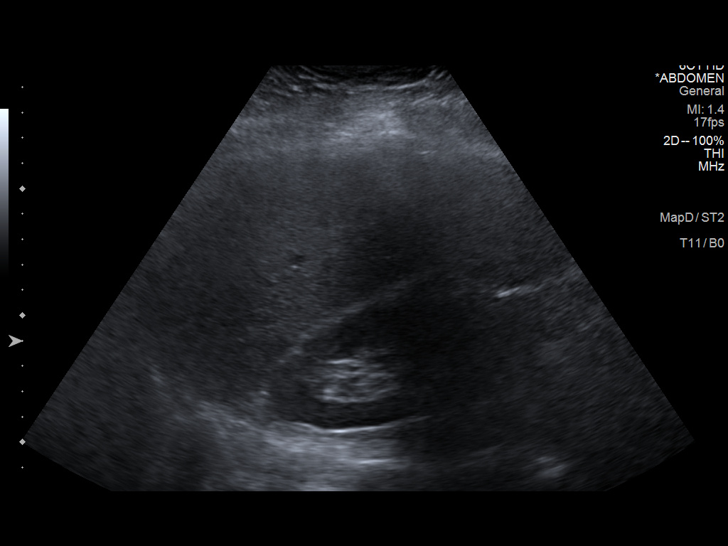
[im 8/48]
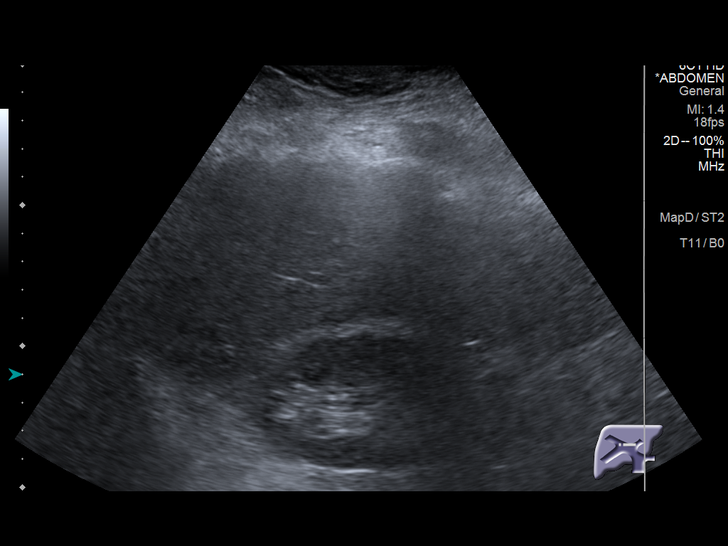
[im 12/48]
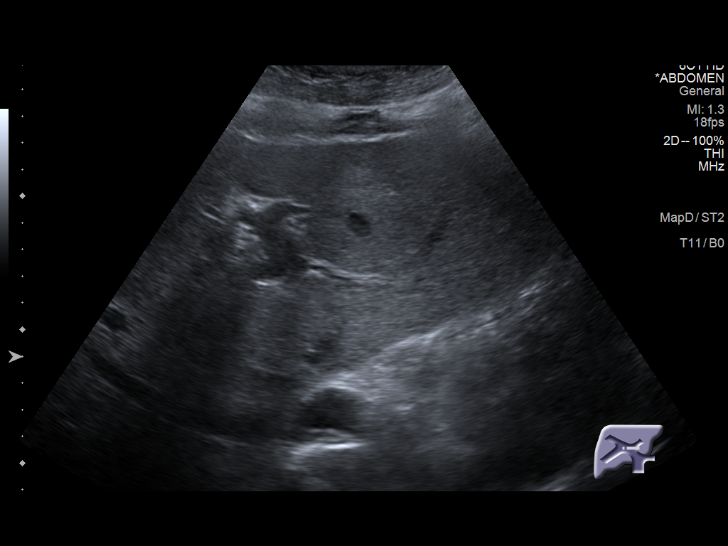
[im 16/48]
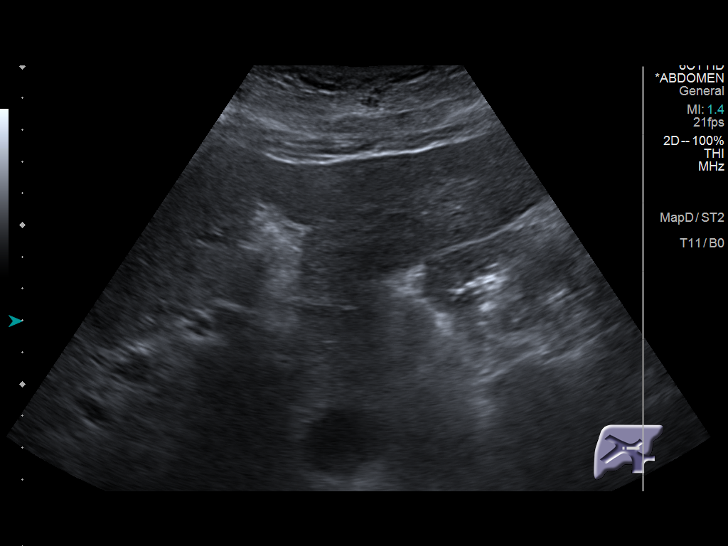
[im 18/48]
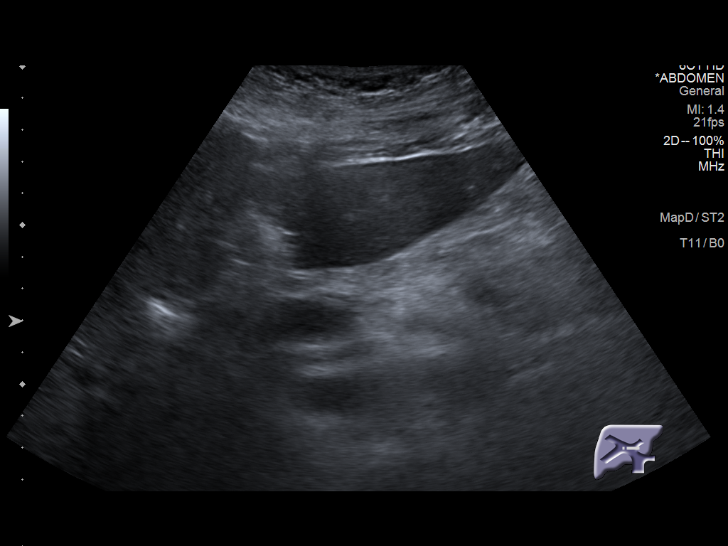
[im 22/48]
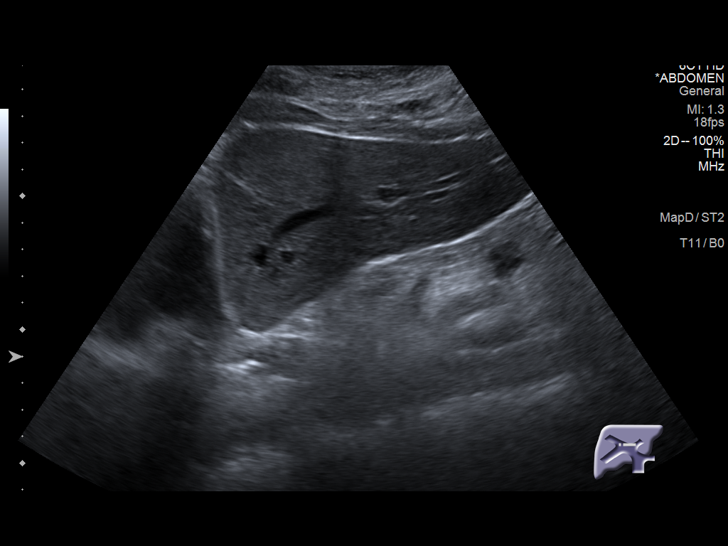
[im 26/48]
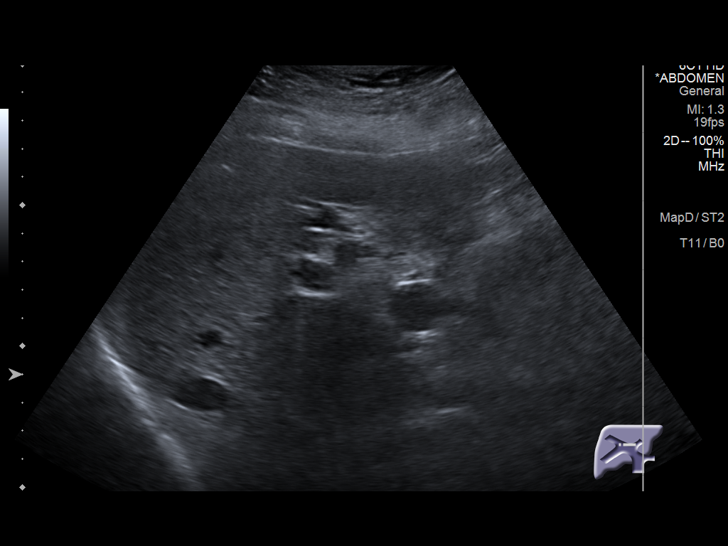
[im 30/48]
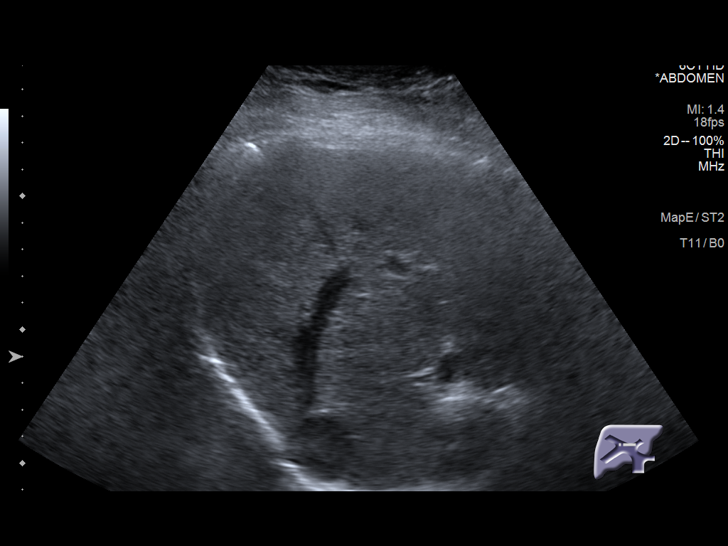
[im 32/48]
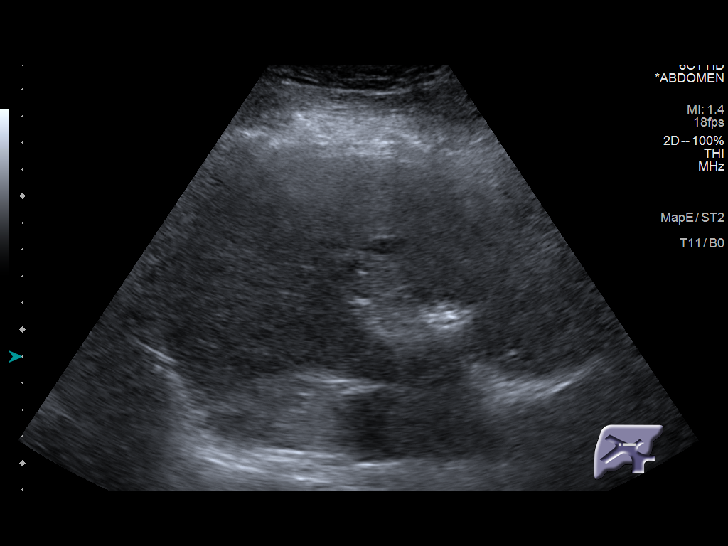
[im 36/48]
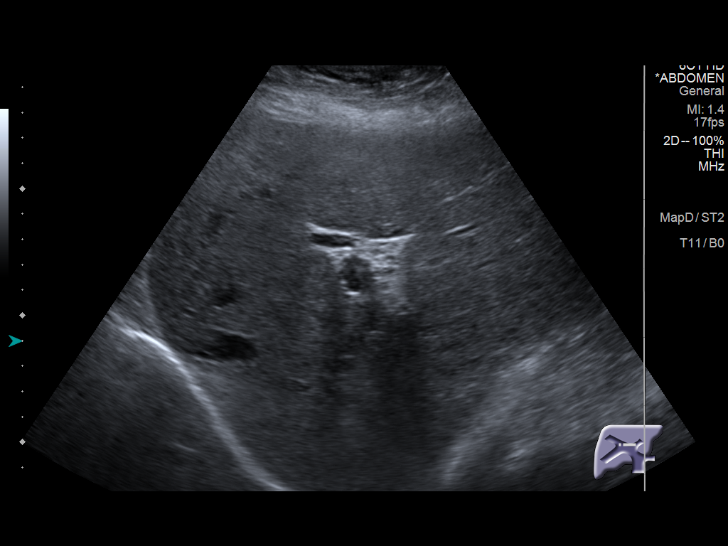
[im 40/48]
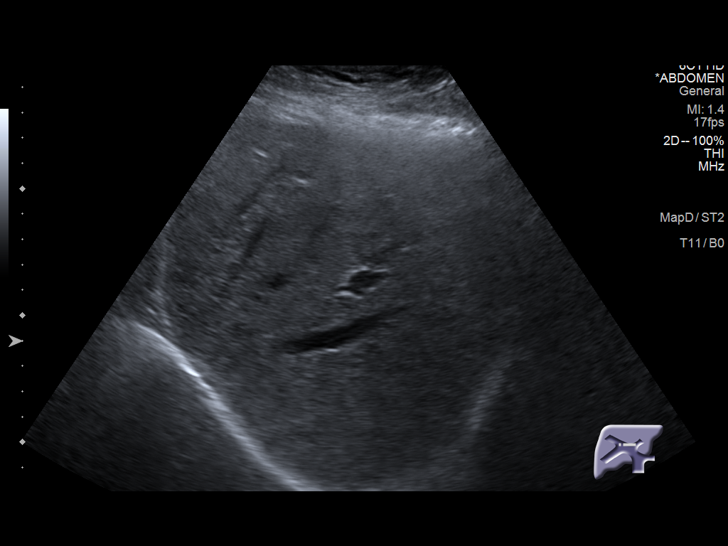
[im 44/48]
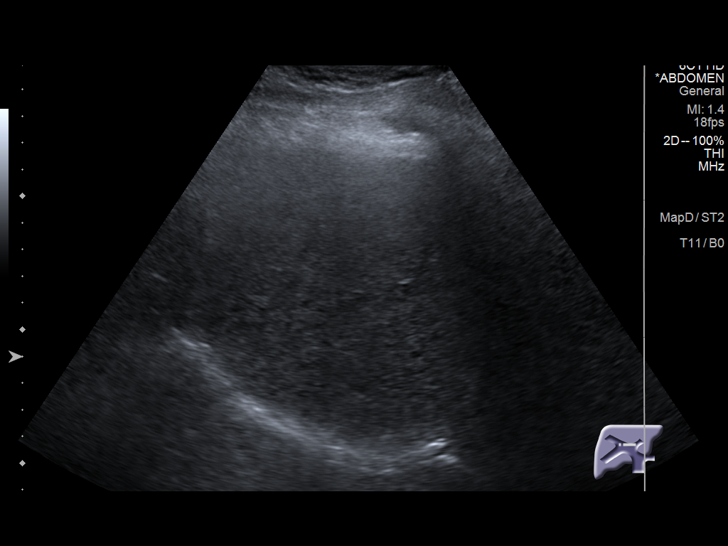
[im 48/48]
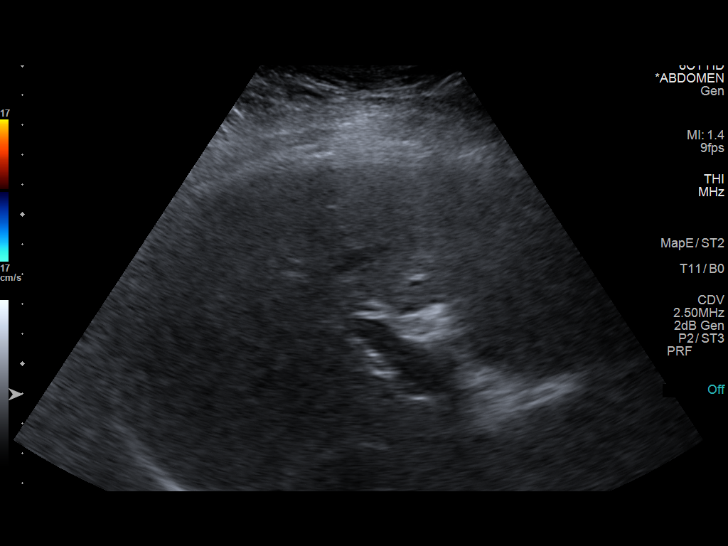

[14 of 25 positions shown; findings below may reference images not displayed]

FINDINGS: Gallbladder:

Gallbladder surgically absent.

Common bile duct:

Diameter: 6.1 millimeter

Liver:

Mildly echogenic without other features of hepatic steatosis. No
focal liver lesion. Portal vein is patent on color Doppler imaging
with normal direction of blood flow towards the liver.

Other: None.
IMPRESSION: No evidence for acute abnormality.  Cholecystectomy.

## 2022-04-19 ENCOUNTER — Other Ambulatory Visit (HOSPITAL_COMMUNITY): Payer: Self-pay

## 2022-04-25 ENCOUNTER — Other Ambulatory Visit (HOSPITAL_COMMUNITY): Payer: Self-pay

## 2022-04-30 ENCOUNTER — Encounter: Payer: Self-pay | Admitting: Family Medicine

## 2022-05-01 ENCOUNTER — Other Ambulatory Visit: Payer: Self-pay | Admitting: Sports Medicine

## 2022-05-01 DIAGNOSIS — M5416 Radiculopathy, lumbar region: Secondary | ICD-10-CM

## 2022-05-02 ENCOUNTER — Other Ambulatory Visit (HOSPITAL_COMMUNITY): Payer: Self-pay

## 2022-05-02 MED ORDER — GABAPENTIN 600 MG PO TABS
ORAL_TABLET | Freq: Three times a day (TID) | ORAL | 1 refills | Status: DC
  Filled 2022-05-02: qty 180, 30d supply, fill #0
  Filled 2022-10-31: qty 180, 30d supply, fill #1

## 2022-05-03 ENCOUNTER — Other Ambulatory Visit: Payer: Self-pay | Admitting: *Deleted

## 2022-05-03 ENCOUNTER — Other Ambulatory Visit (HOSPITAL_COMMUNITY): Payer: Self-pay

## 2022-05-03 MED ORDER — ONDANSETRON 8 MG PO TBDP
8.0000 mg | ORAL_TABLET | Freq: Three times a day (TID) | ORAL | 0 refills | Status: DC | PRN
  Filled 2022-05-03: qty 90, 30d supply, fill #0

## 2022-05-03 NOTE — Telephone Encounter (Signed)
Ok to send in Rx.  Algis Greenhouse. Jerline Pain, MD 05/03/2022 10:18 AM

## 2022-05-04 NOTE — Telephone Encounter (Signed)
Rx faxed

## 2022-06-07 ENCOUNTER — Other Ambulatory Visit (HOSPITAL_COMMUNITY): Payer: Self-pay

## 2022-06-13 ENCOUNTER — Encounter (INDEPENDENT_AMBULATORY_CARE_PROVIDER_SITE_OTHER): Payer: Self-pay

## 2022-06-28 ENCOUNTER — Ambulatory Visit (INDEPENDENT_AMBULATORY_CARE_PROVIDER_SITE_OTHER): Payer: 59 | Admitting: Sports Medicine

## 2022-06-28 ENCOUNTER — Encounter: Payer: Self-pay | Admitting: Sports Medicine

## 2022-06-28 ENCOUNTER — Other Ambulatory Visit (HOSPITAL_BASED_OUTPATIENT_CLINIC_OR_DEPARTMENT_OTHER): Payer: Self-pay

## 2022-06-28 DIAGNOSIS — J0100 Acute maxillary sinusitis, unspecified: Secondary | ICD-10-CM

## 2022-06-28 DIAGNOSIS — M5416 Radiculopathy, lumbar region: Secondary | ICD-10-CM

## 2022-06-28 DIAGNOSIS — M1612 Unilateral primary osteoarthritis, left hip: Secondary | ICD-10-CM

## 2022-06-28 MED ORDER — HYDROCODONE-ACETAMINOPHEN 10-325 MG PO TABS
1.0000 | ORAL_TABLET | Freq: Three times a day (TID) | ORAL | 0 refills | Status: DC | PRN
  Filled 2022-06-28: qty 30, 10d supply, fill #0

## 2022-06-28 MED ORDER — AZITHROMYCIN 250 MG PO TABS
ORAL_TABLET | ORAL | 0 refills | Status: DC
  Filled 2022-06-28: qty 6, 5d supply, fill #0

## 2022-06-28 MED ORDER — TRAMADOL HCL 50 MG PO TABS
100.0000 mg | ORAL_TABLET | Freq: Three times a day (TID) | ORAL | 2 refills | Status: DC
  Filled 2022-06-28: qty 180, 30d supply, fill #0
  Filled 2022-09-05: qty 180, 30d supply, fill #1
  Filled 2022-11-01: qty 180, 30d supply, fill #2

## 2022-06-28 NOTE — Assessment & Plan Note (Signed)
Adding azithromycin, return as needed.

## 2022-06-28 NOTE — Progress Notes (Signed)
    Procedures performed today:    None.  Independent interpretation of notes and tests performed by another provider:   None.  Brief History, Exam, Impression, and Recommendations:    Primary osteoarthritis of left hip Pleasant 51 year old female, known hip osteoarthritis, she has had some injections, she does well with some tramadol and occasional hydrocodone, I am happy to refill this for now.  Acute maxillary sinusitis Adding azithromycin, return as needed.    ____________________________________________ Ihor Austin. Benjamin Stain, M.D., ABFM., CAQSM., AME. Primary Care and Sports Medicine [facility]  Adjunct Professor of Family Medicine  [place] [employer]  Restaurant manager, fast food

## 2022-06-28 NOTE — Assessment & Plan Note (Signed)
Pleasant 51 year old female, known hip osteoarthritis, she has had some injections, she does well with some tramadol and occasional hydrocodone, I am happy to refill this for now.

## 2022-07-23 ENCOUNTER — Encounter: Payer: 59 | Admitting: Family Medicine

## 2022-07-25 ENCOUNTER — Ambulatory Visit (INDEPENDENT_AMBULATORY_CARE_PROVIDER_SITE_OTHER): Payer: 59 | Admitting: Family Medicine

## 2022-07-25 ENCOUNTER — Other Ambulatory Visit (HOSPITAL_COMMUNITY): Payer: Self-pay

## 2022-07-25 ENCOUNTER — Encounter: Payer: Self-pay | Admitting: Family Medicine

## 2022-07-25 VITALS — BP 95/64 | HR 65 | Temp 97.7°F | Ht 66.0 in | Wt 215.4 lb

## 2022-07-25 DIAGNOSIS — R739 Hyperglycemia, unspecified: Secondary | ICD-10-CM

## 2022-07-25 DIAGNOSIS — Z0001 Encounter for general adult medical examination with abnormal findings: Secondary | ICD-10-CM | POA: Diagnosis not present

## 2022-07-25 DIAGNOSIS — F319 Bipolar disorder, unspecified: Secondary | ICD-10-CM | POA: Diagnosis not present

## 2022-07-25 DIAGNOSIS — Z13220 Encounter for screening for lipoid disorders: Secondary | ICD-10-CM | POA: Diagnosis not present

## 2022-07-25 DIAGNOSIS — Z23 Encounter for immunization: Secondary | ICD-10-CM | POA: Diagnosis not present

## 2022-07-25 DIAGNOSIS — E039 Hypothyroidism, unspecified: Secondary | ICD-10-CM

## 2022-07-25 LAB — COMPREHENSIVE METABOLIC PANEL
ALT: 19 U/L (ref 0–35)
AST: 21 U/L (ref 0–37)
Albumin: 3.5 g/dL (ref 3.5–5.2)
Alkaline Phosphatase: 90 U/L (ref 39–117)
BUN: 7 mg/dL (ref 6–23)
CO2: 28 mEq/L (ref 19–32)
Calcium: 9 mg/dL (ref 8.4–10.5)
Chloride: 105 mEq/L (ref 96–112)
Creatinine, Ser: 0.58 mg/dL (ref 0.40–1.20)
GFR: 106.55 mL/min (ref >60.00)
Glucose, Bld: 90 mg/dL (ref 70–99)
Potassium: 4.3 mEq/L (ref 3.5–5.1)
Sodium: 139 mEq/L (ref 135–145)
Total Bilirubin: 0.6 mg/dL (ref 0.2–1.2)
Total Protein: 6.1 g/dL (ref 6.0–8.3)

## 2022-07-25 LAB — HEMOGLOBIN A1C: Hgb A1c MFr Bld: 5.8 % (ref 4.6–6.5)

## 2022-07-25 LAB — CBC
HCT: 39.3 % (ref 36.0–46.0)
Hemoglobin: 12.9 g/dL (ref 12.0–15.0)
MCHC: 33 g/dL (ref 30.0–36.0)
MCV: 84.7 fl (ref 78.0–100.0)
Platelets: 201 10*3/uL (ref 150.0–400.0)
RBC: 4.64 Mil/uL (ref 3.87–5.11)
RDW: 14.5 % (ref 11.5–15.5)
WBC: 6.4 10*3/uL (ref 4.0–10.5)

## 2022-07-25 LAB — TSH: TSH: 0 u[IU]/mL — ABNORMAL LOW (ref 0.35–5.50)

## 2022-07-25 LAB — LIPID PANEL
Cholesterol: 119 mg/dL (ref 0–200)
HDL: 46.1 mg/dL (ref >39.00)
LDL Cholesterol: 60 mg/dL (ref 0–99)
NonHDL: 72.41
Total CHOL/HDL Ratio: 3
Triglycerides: 60 mg/dL (ref 0.0–149.0)
VLDL: 12 mg/dL (ref 0.0–40.0)

## 2022-07-25 MED ORDER — HYDROXYZINE HCL 50 MG PO TABS
50.0000 mg | ORAL_TABLET | Freq: Every evening | ORAL | 0 refills | Status: AC | PRN
  Filled 2022-07-25: qty 90, 90d supply, fill #0

## 2022-07-25 NOTE — Assessment & Plan Note (Signed)
On Synthroid 200 mcg daily.  Check TSH today.

## 2022-07-25 NOTE — Assessment & Plan Note (Signed)
She will be moving to [facility] soon as she has increased her anxiety a little bit but overall symptoms have been stable.  We will continue her current regimen Lamictal 300 mg daily, hydroxyzine 50 mg daily, trazodone 50 mg nightly, and Xanax as needed.  We will refill her hydroxyzine today.  She will follow-up with new PCP within the next 6 months.

## 2022-07-25 NOTE — Progress Notes (Signed)
Chief Complaint:  Nancy Mills is a 51 y.o. female who presents today for her annual comprehensive physical exam.    Assessment/Plan:  Chronic Problems Addressed Today: Bipolar 1 disorder ([place]) She will be moving to [place] soon as she has increased her anxiety a little bit but overall symptoms have been stable.  We will continue her current regimen Lamictal 300 mg daily, hydroxyzine 50 mg daily, trazodone 50 mg nightly, and Xanax as needed.  We will refill her hydroxyzine today.  She will follow-up with new PCP within the next 6 months.  Hypothyroidism On Synthroid 200 mcg daily.  Check TSH today.   Preventative Healthcare: Flu shot given today.  Check labs.  Patient Counseling(The following topics were reviewed and/or handout was given):  -Nutrition: Stressed importance of moderation in sodium/caffeine intake, saturated fat and cholesterol, caloric balance, sufficient intake of fresh fruits, vegetables, and fiber.  -Stressed the importance of regular exercise.   -Substance Abuse: Discussed cessation/primary prevention of tobacco, alcohol, or other drug use; driving or other dangerous activities under the influence; availability of treatment for abuse.   -Injury prevention: Discussed safety belts, safety helmets, smoke detector, smoking near bedding or upholstery.   -Sexuality: Discussed sexually transmitted diseases, partner selection, use of condoms, avoidance of unintended pregnancy and contraceptive alternatives.   -Dental health: Discussed importance of regular tooth brushing, flossing, and dental visits.  -Health maintenance and immunizations reviewed. Please refer to Health maintenance section.  Return to care in 1 year for next preventative visit.     Subjective:  HPI:  She has no acute complaints today.  See A/P for status of chronic conditions.     07/25/2022    9:28 AM  Depression screen PHQ 2/9  Decreased Interest 0  Down, Depressed, Hopeless 0  PHQ  - 2 Score 0    There are no preventive care reminders to display for this patient.    ROS: Per HPI, otherwise a complete review of systems was negative.   PMH:  The following were reviewed and entered/updated in epic: Past Medical History:  Diagnosis Date   Adjustment disorder with mixed anxiety and depressed mood    Anxiety    Asthma    Bipolar 1 disorder ([place])    Depression    Diabetes mellitus without complication ([place])    HSV-2 infection    Liver cirrhosis secondary to NASH ([place])    Migraines    Osteoarthritis    Severe obesity ([place])    Thyroid disease    Patient Active Problem List   Diagnosis Date Noted   Primary osteoarthritis of left hip 01/18/2021   Pain of right great toe 08/17/2020   S/P gastric bypass 04/26/2020   Spinal stenosis of lumbar region 03/05/2019   Lumbar radiculopathy 03/05/2019   Bipolar 1 disorder ([place]) 03/27/2018   HSV-2 infection 05/21/2017   Liver cirrhosis secondary to NASH ([place]) 05/09/2016   Portal venous hypertension ([place]) 05/09/2016   Irritable bowel syndrome with diarrhea 05/09/2016   Hypothyroidism 02/09/2016   Past Surgical History:  Procedure Laterality Date   ABDOMINAL SURGERY     abdomnal wall reconstruction     CESAREAN SECTION  2001 2004   CHOLECYSTECTOMY     RIGHT OOPHORECTOMY      Family History  Problem Relation Age of Onset   Depression Mother    Mental illness Mother    Cancer Father    Depression Father    Diabetes Father    Depression Brother  Heart attack Maternal Grandfather    Heart disease Maternal Grandfather    High blood pressure Maternal Grandfather    Stroke Maternal Grandfather    COPD Paternal Grandmother    COPD Paternal Grandfather    Heart attack Paternal Grandfather    High blood pressure Paternal Grandfather     Medications- reviewed and updated Current Outpatient Medications  Medication Sig Dispense Refill   albuterol (PROVENTIL HFA;VENTOLIN HFA) 108 (90 BASE) MCG/ACT inhaler  Inhale 2 puffs into the lungs every 6 (six) hours as needed for wheezing.      ALPRAZolam (XANAX) 1 MG tablet Take 1 tablet (1 mg total) by mouth 2 (two) times daily as needed for anxiety. 60 tablet 1   folic acid (FOLVITE) 1 MG tablet Take 1 mg by mouth daily.     gabapentin (NEURONTIN) 600 MG tablet TAKE 2 TABLETS BY MOUTH 3 TIMES DAILY 180 tablet 1   HYDROcodone-acetaminophen (NORCO) 10-325 MG tablet Take 1 tablet by mouth every 8 (eight) hours as needed. 30 tablet 0   lamoTRIgine (LAMICTAL) 200 MG tablet TAKE 1 TABLET BY MOUTH TWO TIMES DAILY 180 tablet 1   levonorgestrel (MIRENA) 20 MCG/24HR IUD 1 each by Intrauterine route once.     levothyroxine (SYNTHROID) 200 MCG tablet TAKE 1 TABLET BY MOUTH ONCE A DAY 90 tablet 3   Multiple Vitamins-Minerals (OPURITY BYPASS OPTIMIZED) CHEW Chew 1 tablet by mouth daily.      omeprazole (PRILOSEC) 20 MG capsule TAKE 1 CAPSULE BY MOUTH ONCE A DAY 90 capsule 3   ondansetron (ZOFRAN-ODT) 8 MG disintegrating tablet Dissolve 1 tablet by mouth every 8 hours as needed for nausea or vomiting. 90 tablet 0   Probiotic Product (PROBIOTIC-10 PO) Take 1 capsule by mouth daily.      scopolamine (TRANSDERM-SCOP) 1 MG/3DAYS Place 1 patch (1.5 mg total) onto the skin every 3 (three) days. 4 patch 0   senna-docusate (SENOKOT-S) 8.6-50 MG tablet Take 1 tablet by mouth daily.      traMADol (ULTRAM) 50 MG tablet Take 2 tablets (100 mg total) by mouth in the morning, at noon, and at bedtime. 180 tablet 2   traZODone (DESYREL) 50 MG tablet Take 1 tablet (50 mg total) by mouth at bedtime as needed for sleep. 90 tablet 3   valACYclovir (VALTREX) 500 MG tablet TAKE 2 TABLETS BY MOUTH ONCE DAILY 180 tablet 1   vitamin B-12 (CYANOCOBALAMIN) 100 MCG tablet Take 100 mcg by mouth daily.     hydrOXYzine (ATARAX) 50 MG tablet Take 1 tablet (50 mg total) by mouth at bedtime as needed for anxiety. 90 tablet 0   No current facility-administered medications for this visit.     Allergies-reviewed and updated Allergies  Allergen Reactions   Other Anaphylaxis    Covi-19 Vaccine (Pfiser)   Clindamycin/Lincomycin Diarrhea    Severe diarrhea, cannot tolerate   Latex Rash   Nsaids     Due to history of gastric bypass   Erythromycin    Penicillins    Chicken Allergy Other (See Comments)    Gastric bypass surgery, can not digest    Codeine    Morphine And Related Nausea And Vomiting    Social History   Socioeconomic History   Marital status: Legally Separated    Spouse name: Not on file   Number of children: Not on file   Years of education: Not on file   Highest education level: Not on file  Occupational History   Not on file  Tobacco Use   Smoking status: Every Day    Packs/day: 0.50    Years: 20.00    Total pack years: 10.00    Types: Cigarettes   Smokeless tobacco: Never  Substance and Sexual Activity   Alcohol use: Yes   Drug use: No   Sexual activity: Yes    Partners: Male    Birth control/protection: I.U.D.  Other Topics Concern   Not on file  Social History Narrative   Not on file   Social Determinants of Health   Financial Resource Strain: Not on file  Food Insecurity: Not on file  Transportation Needs: Not on file  Physical Activity: Not on file  Stress: Not on file  Social Connections: Not on file        Objective:  Physical Exam: BP 95/64   Pulse 65   Temp 97.7 F (36.5 C) (Temporal)   Ht 5\' 6"  (1.676 m)   Wt 215 lb 6.4 oz (97.7 kg)   SpO2 99%   BMI 34.77 kg/m   Body mass index is 34.77 kg/m. Wt Readings from Last 3 Encounters:  07/25/22 215 lb 6.4 oz (97.7 kg)  06/28/22 218 lb (98.9 kg)  01/18/22 236 lb 12.8 oz (107.4 kg)   Gen: NAD, resting comfortably HEENT: TMs normal bilaterally. OP clear. No thyromegaly noted.  CV: RRR with no murmurs appreciated Pulm: NWOB, CTAB with no crackles, wheezes, or rhonchi GI: Normal bowel sounds present. Soft, Nontender, Nondistended. MSK: no edema, cyanosis, or  clubbing noted Skin: warm, dry Neuro: CN2-12 grossly intact. Strength 5/5 in upper and lower extremities. Reflexes symmetric and intact bilaterally.  Psych: Normal affect and thought content     Nancy Mills M. 01/20/22, MD 07/25/2022 10:09 AM

## 2022-07-25 NOTE — Patient Instructions (Addendum)
It was very nice to see you today!  We will give you a flu shot today.  We will check blood work.  I will refill your hydroxyzine.   Take care, Dr Jerline Pain  PLEASE NOTE:  If you had any lab tests please let us know if you have not heard back within a few days. You may see your results on mychart before we have a chance to review them but we will give you a call once they are reviewed by [place]. If we ordered any referrals today, please let us know if you have not heard from their office within the next week.   Please try these tips to maintain a healthy lifestyle:  Eat at least 3 REAL meals and 1-2 snacks per day.  Aim for no more than 5 hours between eating.  If you eat breakfast, please do so within one hour of getting up.   Each meal should contain half fruits/vegetables, one quarter protein, and one quarter carbs (no bigger than a computer mouse)  Cut down on sweet beverages. This includes juice, soda, and sweet tea.   Drink at least 1 glass of water with each meal and aim for at least 8 glasses per day  Exercise at least 150 minutes every week.    Preventive Care 61-52 Years Old, Female Preventive care refers to lifestyle choices and visits with your health care provider that can promote health and wellness. Preventive care visits are also called wellness exams. What can I expect for my preventive care visit? Counseling Your health care provider may ask you questions about your: Medical history, including: Past medical problems. Family medical history. Pregnancy history. Current health, including: Menstrual cycle. Method of birth control. Emotional well-being. Home life and relationship well-being. Sexual activity and sexual health. Lifestyle, including: Alcohol, nicotine or tobacco, and drug use. Access to firearms. Diet, exercise, and sleep habits. Work and work Statistician. Sunscreen use. Safety issues such as seatbelt and bike helmet use. Physical exam Your health  care provider will check your: Height and weight. These may be used to calculate your BMI (body mass index). BMI is a measurement that tells if you are at a healthy weight. Waist circumference. This measures the distance around your waistline. This measurement also tells if you are at a healthy weight and may help predict your risk of certain diseases, such as type 2 diabetes and high blood pressure. Heart rate and blood pressure. Body temperature. Skin for abnormal spots. What immunizations do I need?  Vaccines are usually given at various ages, according to a schedule. Your health care provider will recommend vaccines for you based on your age, medical history, and lifestyle or other factors, such as travel or where you work. What tests do I need? Screening Your health care provider may recommend screening tests for certain conditions. This may include: Lipid and cholesterol levels. Diabetes screening. This is done by checking your blood sugar (glucose) after you have not eaten for a while (fasting). Pelvic exam and Pap test. Hepatitis B test. Hepatitis C test. HIV (human immunodeficiency virus) test. STI (sexually transmitted infection) testing, if you are at risk. Lung cancer screening. Colorectal cancer screening. Mammogram. Talk with your health care provider about when you should start having regular mammograms. This may depend on whether you have a family history of breast cancer. BRCA-related cancer screening. This may be done if you have a family history of breast, ovarian, tubal, or peritoneal cancers. Bone density scan. This is done to  screen for osteoporosis. Talk with your health care provider about your test results, treatment options, and if necessary, the need for more tests. Follow these instructions at home: Eating and drinking  Eat a diet that includes fresh fruits and vegetables, whole grains, lean protein, and low-fat dairy products. Take vitamin and mineral  supplements as recommended by your health care provider. Do not drink alcohol if: Your health care provider tells you not to drink. You are pregnant, may be pregnant, or are planning to become pregnant. If you drink alcohol: Limit how much you have to 0-1 drink a day. Know how much alcohol is in your drink. In the [place] one drink equals one 12 oz bottle of beer (355 mL), one 5 oz glass of wine (148 mL), or one 1 oz glass of hard liquor (44 mL). Lifestyle Brush your teeth every morning and night with fluoride toothpaste. Floss one time each day. Exercise for at least 30 minutes 5 or more days each week. Do not use any products that contain nicotine or tobacco. These products include cigarettes, chewing tobacco, and vaping devices, such as e-cigarettes. If you need help quitting, ask your health care provider. Do not use drugs. If you are sexually active, practice safe sex. Use a condom or other form of protection to prevent STIs. If you do not wish to become pregnant, use a form of birth control. If you plan to become pregnant, see your health care provider for a prepregnancy visit. Take aspirin only as told by your health care provider. Make sure that you understand how much to take and what form to take. Work with your health care provider to find out whether it is safe and beneficial for you to take aspirin daily. Find healthy ways to manage stress, such as: Meditation, yoga, or listening to music. Journaling. Talking to a trusted person. Spending time with friends and family. Minimize exposure to UV radiation to reduce your risk of skin cancer. Safety Always wear your seat belt while driving or riding in a vehicle. Do not drive: If you have been drinking alcohol. Do not ride with someone who has been drinking. When you are tired or distracted. While texting. If you have been using any mind-altering substances or drugs. Wear a helmet and other protective equipment during sports  activities. If you have firearms in your house, make sure you follow all gun safety procedures. Seek help if you have been physically or sexually abused. What's next? Visit your health care provider once a year for an annual wellness visit. Ask your health care provider how often you should have your eyes and teeth checked. Stay up to date on all vaccines. This information is not intended to replace advice given to you by your health care provider. Make sure you discuss any questions you have with your health care provider. Document Revised: 04/19/2021 Document Reviewed: 04/19/2021 Elsevier Patient Education  [place].

## 2022-07-27 NOTE — Progress Notes (Signed)
Please inform patient of the following:  TSH is too low.  It is possible she may be getting too much thyroid medication I would like for her to come back to recheck TSH before we make any dose changes to make sure the value is accurate.  Please place future order for TSH.  Her blood sugar is borderline but everything else is stable.  Do not need to make any changes to treatment plan at this time.  She should continue to work on diet and exercise and we can recheck in about a year.

## 2022-07-31 ENCOUNTER — Telehealth: Payer: Self-pay | Admitting: Family Medicine

## 2022-07-31 ENCOUNTER — Other Ambulatory Visit: Payer: Self-pay | Admitting: *Deleted

## 2022-07-31 DIAGNOSIS — E039 Hypothyroidism, unspecified: Secondary | ICD-10-CM

## 2022-07-31 NOTE — Telephone Encounter (Signed)
See results note. 

## 2022-07-31 NOTE — Telephone Encounter (Signed)
Pt states: -returning call about results. -will be available until 2:30 today 07/31/22  Pt requests: -call back

## 2022-08-01 ENCOUNTER — Other Ambulatory Visit (HOSPITAL_COMMUNITY): Payer: Self-pay

## 2022-08-01 ENCOUNTER — Other Ambulatory Visit: Payer: 59

## 2022-08-01 ENCOUNTER — Other Ambulatory Visit: Payer: Self-pay | Admitting: Family Medicine

## 2022-08-01 MED ORDER — LAMOTRIGINE 200 MG PO TABS
200.0000 mg | ORAL_TABLET | Freq: Two times a day (BID) | ORAL | 1 refills | Status: AC
Start: 2022-08-01 — End: 2023-08-01
  Filled 2022-08-01: qty 180, 90d supply, fill #0
  Filled 2022-10-31: qty 180, 90d supply, fill #1

## 2022-08-02 ENCOUNTER — Other Ambulatory Visit (INDEPENDENT_AMBULATORY_CARE_PROVIDER_SITE_OTHER): Payer: 59

## 2022-08-02 DIAGNOSIS — E039 Hypothyroidism, unspecified: Secondary | ICD-10-CM | POA: Diagnosis not present

## 2022-08-02 LAB — TSH: TSH: <0.01 u[IU]/mL — ABNORMAL LOW (ref 0.35–5.50)

## 2022-08-08 ENCOUNTER — Other Ambulatory Visit: Payer: Self-pay | Admitting: *Deleted

## 2022-08-08 DIAGNOSIS — E039 Hypothyroidism, unspecified: Secondary | ICD-10-CM

## 2022-08-08 MED ORDER — LEVOTHYROXINE SODIUM 175 MCG PO TABS
175.0000 ug | ORAL_TABLET | Freq: Every day | ORAL | 3 refills | Status: AC
  Filled 2022-08-08: qty 90, 90d supply, fill #0
  Filled 2022-10-31: qty 90, 90d supply, fill #1

## 2022-08-08 NOTE — Progress Notes (Signed)
Please inform patient of the following:  Her tsh is too low.  She is getting too much thyroid.  Recommend sending in a prescription for Synthroid 175 mcg daily.  She should have this level rechecked in 6 weeks.

## 2022-08-09 ENCOUNTER — Other Ambulatory Visit (HOSPITAL_COMMUNITY): Payer: Self-pay

## 2022-08-20 ENCOUNTER — Other Ambulatory Visit: Payer: Self-pay | Admitting: Medical-Surgical

## 2022-08-21 ENCOUNTER — Other Ambulatory Visit (HOSPITAL_COMMUNITY): Payer: Self-pay

## 2022-08-21 MED ORDER — SCOPOLAMINE 1 MG/3DAYS TD PT72
1.0000 | MEDICATED_PATCH | TRANSDERMAL | 0 refills | Status: AC
  Filled 2022-08-21: qty 12, 36d supply, fill #0

## 2022-08-22 ENCOUNTER — Other Ambulatory Visit (HOSPITAL_COMMUNITY): Payer: Self-pay

## 2022-08-24 ENCOUNTER — Other Ambulatory Visit (HOSPITAL_COMMUNITY): Payer: Self-pay

## 2022-09-05 ENCOUNTER — Other Ambulatory Visit (HOSPITAL_BASED_OUTPATIENT_CLINIC_OR_DEPARTMENT_OTHER): Payer: Self-pay

## 2022-09-05 IMAGING — DX DG HIP (WITH OR WITHOUT PELVIS) 2-3V*L*
3 series · 3 of 3 positions shown · non-contrast
Comparison: None.

CLINICAL DATA: Left groin pain.  Question osteoarthritis.

EXAM:
DG HIP (WITH OR WITHOUT PELVIS) 2-3V LEFT

[pelvis ap]
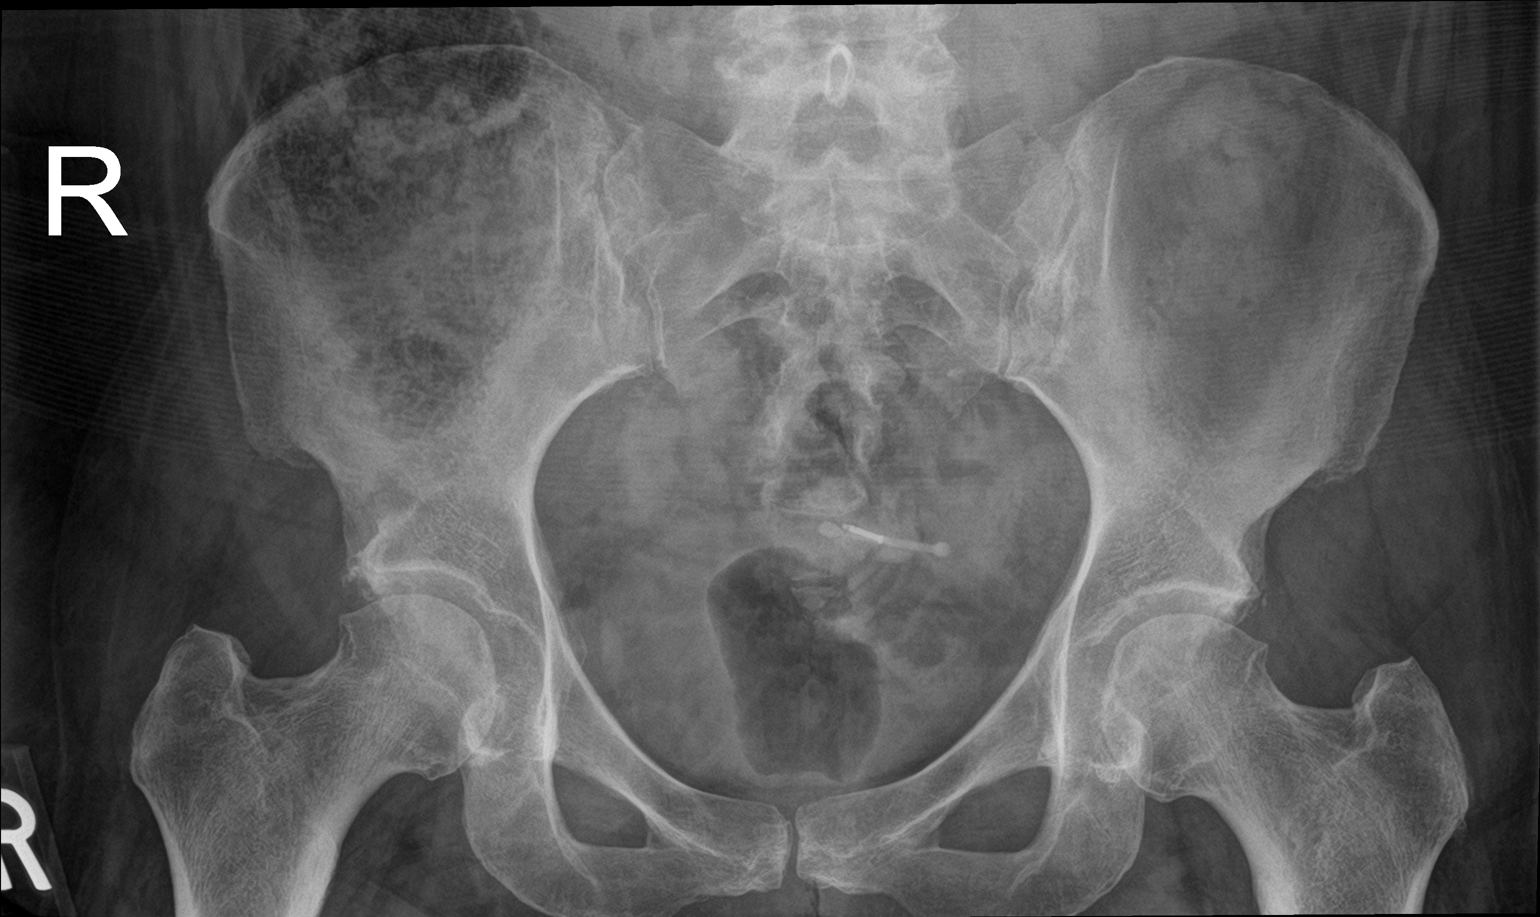

[hip ap]
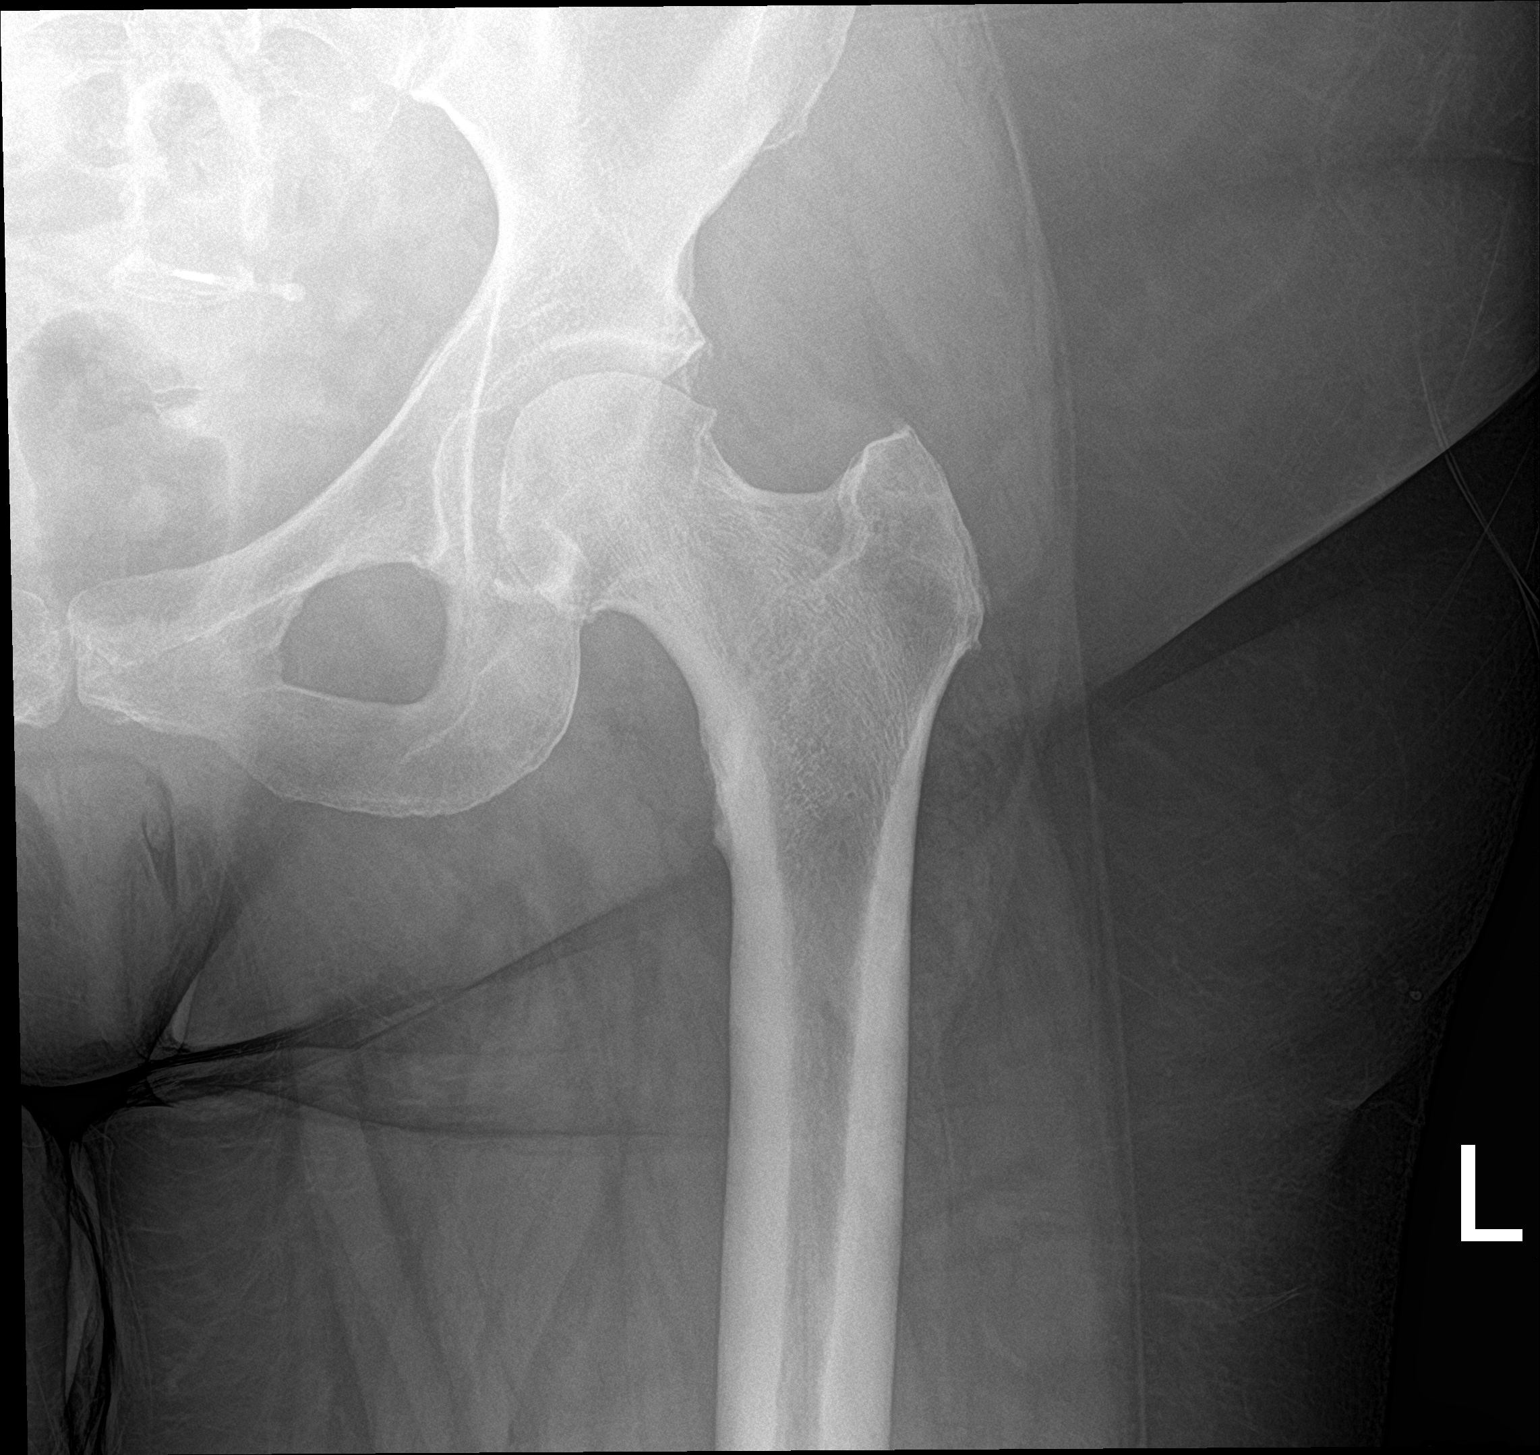

[hip lat]
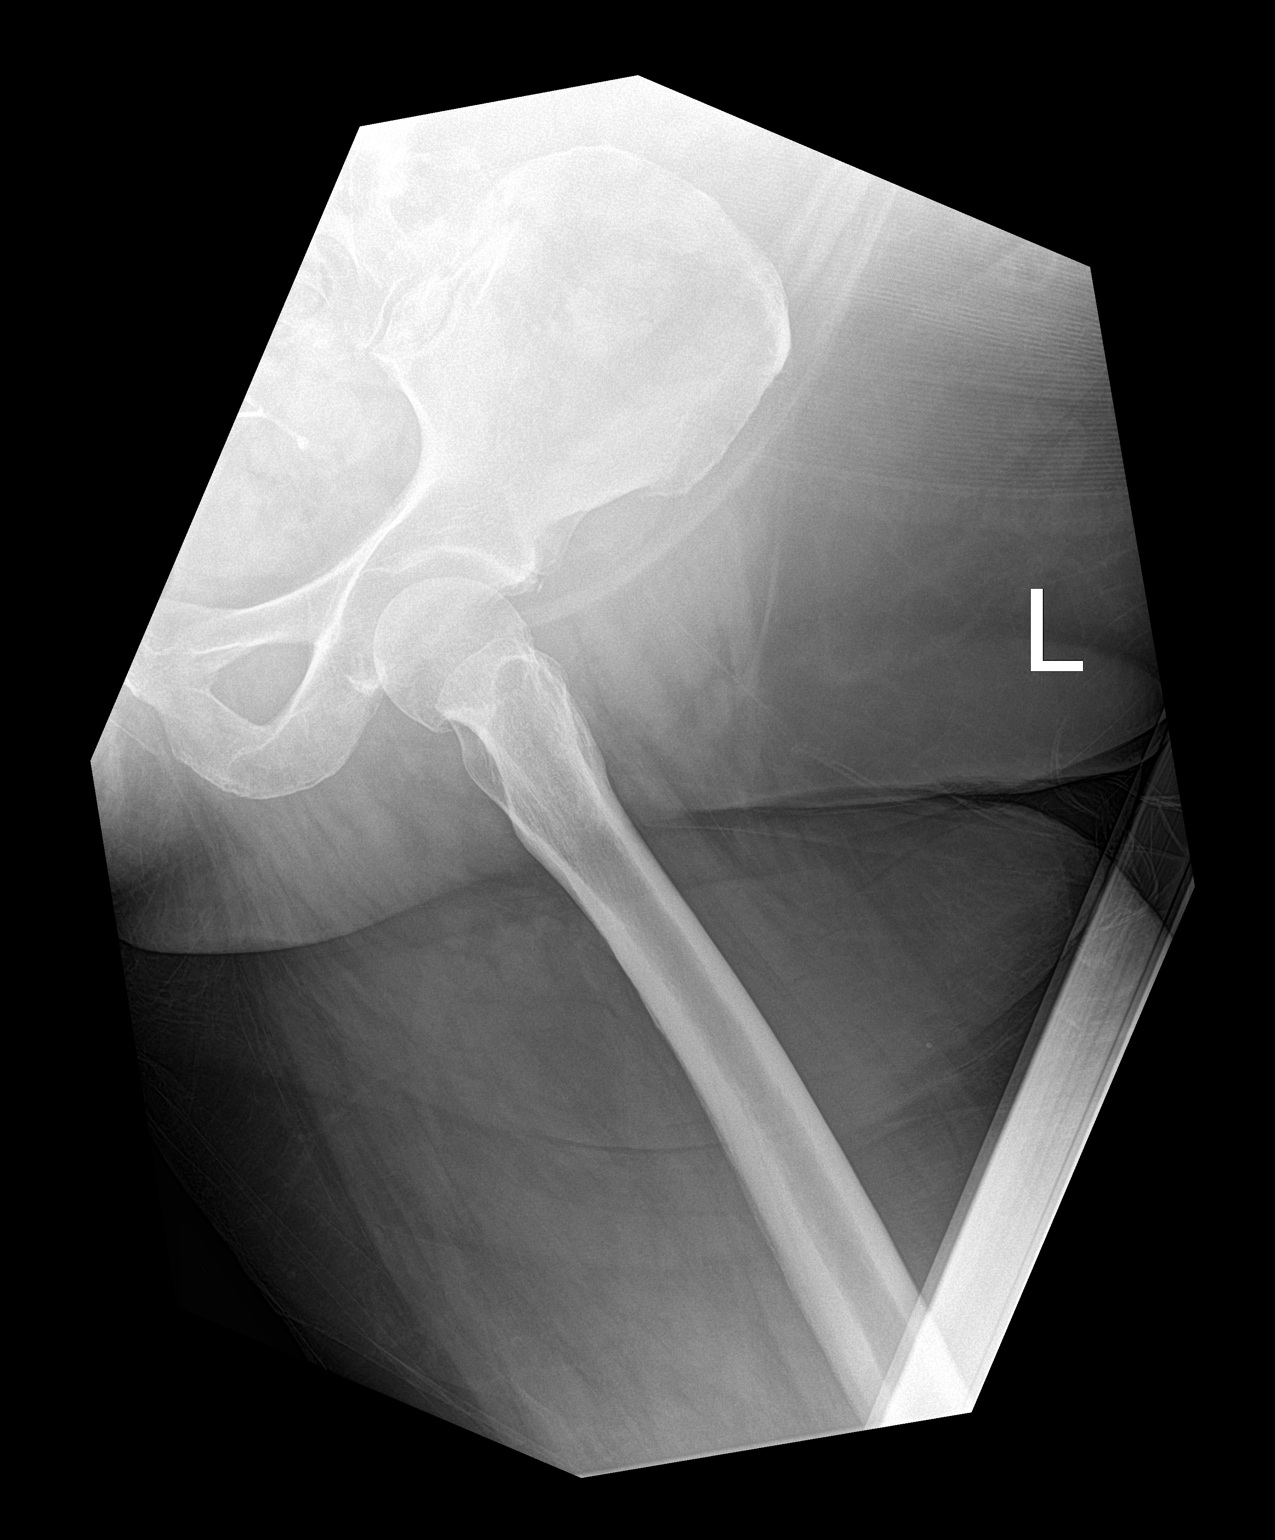

[3 of 3 positions shown; findings below may reference images not displayed]

FINDINGS: No fracture. No subluxation or dislocation. SI joints and symphysis
pubis unremarkable. IUD projects over the left paramidline pelvis.

Joint space in the hips is symmetric and relatively well preserved.
There is hypertrophic spurring in the left femoral head with trace
spurring noted in the roof of the left acetabulum. No worrisome
lytic or sclerotic osseous abnormality.
IMPRESSION: Degenerative spurring in the left femoral head with preservation of
joint space. No acute bony abnormality.

## 2022-09-25 ENCOUNTER — Other Ambulatory Visit (HOSPITAL_COMMUNITY): Payer: Self-pay

## 2022-09-25 ENCOUNTER — Telehealth: Payer: Self-pay

## 2022-09-25 NOTE — Telephone Encounter (Signed)
Patient called and she is starting a new job and and employee health seen she is on gabapentin and hydrocodone and and they just wanted a letter stating that she has no restriction and is able to do her job.

## 2022-09-25 NOTE — Telephone Encounter (Signed)
Letter written and available to download MyChart.

## 2022-10-02 ENCOUNTER — Ambulatory Visit (INDEPENDENT_AMBULATORY_CARE_PROVIDER_SITE_OTHER): Payer: 59 | Admitting: Family Medicine

## 2022-10-02 VITALS — BP 136/87 | Temp 97.9°F | Ht 66.0 in | Wt 221.4 lb

## 2022-10-02 DIAGNOSIS — K529 Noninfective gastroenteritis and colitis, unspecified: Secondary | ICD-10-CM | POA: Diagnosis not present

## 2022-10-02 NOTE — Progress Notes (Signed)
[facility] [address] [place] [address] Dept: [phone] Dept Fax: [phone]  Office Visit  Subjective:    Patient ID: Nancy Mills, female    DOB: [birth year], 51 y.o..   MRN: [id number]  Chief Complaint  Patient presents with   Nausea    Pt c/o nausea vomiting and diarrhea x 4 days     History of Present Illness:  Patient is in today complaining of a 4-day history of recurrent nausea, vomiting, and diarrhea. Nancy Mills is currently going through a planned move to [place], [place]. She will be transferring jobs as well. 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 She initially developed some episodes of vomiting on Saturday. She thought this might just be related to her stressors. She took Zofran ODT that she had on hand, but this did not seem to really help. She has a prior history of a gastric bypass, so has had issues with vomiting at times. She also has a very extensive history of multiple abdominal surgeries. Last night, she developed diarrhea and has had a couple of bouts so far. 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 She has not had concerne for food poisoning. She notes her HR department had concerns with her taking time off right now without confirmation by a physician.  Past Medical History: Patient Active Problem List   Diagnosis Date Noted   Primary osteoarthritis of left hip 01/18/2021   Pain of right great toe 08/17/2020   S/P gastric bypass 04/26/2020   Spinal stenosis of lumbar region 03/05/2019   Lumbar radiculopathy 03/05/2019   Bipolar 1 disorder (HCC) 03/27/2018   HSV-2 infection 05/21/2017   Liver cirrhosis secondary to NASH (HCC) 05/09/2016   Portal venous  hypertension (HCC) 05/09/2016   Irritable bowel syndrome with diarrhea 05/09/2016   Hypothyroidism 02/09/2016   Past Surgical History:  Procedure Laterality Date   ABDOMINAL SURGERY     abdomnal wall reconstruction     CESAREAN SECTION  2001 2004   CHOLECYSTECTOMY     RIGHT OOPHORECTOMY     Family History  Problem Relation Age of Onset   Depression Mother    Mental illness Mother    Cancer Father    Depression Father    Diabetes Father    Depression Brother    Heart attack Maternal Grandfather    Heart disease Maternal Grandfather    High blood pressure Maternal Grandfather    Stroke Maternal Grandfather    COPD Paternal Grandmother    COPD Paternal Grandfather    Heart attack Paternal Grandfather    High blood pressure Paternal Grandfather    Outpatient Medications Prior to Visit  Medication Sig Dispense Refill   albuterol (PROVENTIL HFA;VENTOLIN HFA) 108 (90 BASE) MCG/ACT inhaler Inhale 2 puffs into the lungs every 6 (six) hours as needed for wheezing.      ALPRAZolam (XANAX) 1 MG tablet Take 1 tablet (1 mg total) by mouth 2 (two) times daily as needed for anxiety. 60 tablet 1   folic acid (FOLVITE) 1 MG tablet Take 1 mg by mouth daily.     gabapentin (NEURONTIN) 600 MG tablet TAKE 2 TABLETS BY MOUTH 3 TIMES DAILY 180 tablet 1   HYDROcodone-acetaminophen (NORCO) 10-325  MG tablet Take 1 tablet by mouth every 8 (eight) hours as needed. 30 tablet 0   hydrOXYzine (ATARAX) 50 MG tablet Take 1 tablet (50 mg total) by mouth at bedtime as needed for anxiety. 90 tablet 0   lamoTRIgine (LAMICTAL) 200 MG tablet Take 1 tablet (200 mg total) by mouth 2 (two) times daily. 180 tablet 1   levonorgestrel (MIRENA) 20 MCG/24HR IUD 1 each by Intrauterine route once.     levothyroxine (SYNTHROID) 175 MCG tablet Take 1 tablet (175 mcg total) by mouth daily. 90 tablet 3   Multiple Vitamins-Minerals (OPURITY BYPASS OPTIMIZED) CHEW Chew 1 tablet by mouth daily.      omeprazole (PRILOSEC) 20 MG  capsule TAKE 1 CAPSULE BY MOUTH ONCE A DAY 90 capsule 3   ondansetron (ZOFRAN-ODT) 8 MG disintegrating tablet Dissolve 1 tablet by mouth every 8 hours as needed for nausea or vomiting. 90 tablet 0   Probiotic Product (PROBIOTIC-10 PO) Take 1 capsule by mouth daily.      scopolamine (TRANSDERM-SCOP) 1 MG/3DAYS Place 1 patch onto the skin every 3 days. 12 patch 0   senna-docusate (SENOKOT-S) 8.6-50 MG tablet Take 1 tablet by mouth daily.      traMADol (ULTRAM) 50 MG tablet Take 2 tablets (100 mg total) by mouth in the morning, at noon, and at bedtime. 180 tablet 2   traZODone (DESYREL) 50 MG tablet Take 1 tablet (50 mg total) by mouth at bedtime as needed for sleep. 90 tablet 3   valACYclovir (VALTREX) 500 MG tablet TAKE 2 TABLETS BY MOUTH ONCE DAILY 180 tablet 1   vitamin B-12 (CYANOCOBALAMIN) 100 MCG tablet Take 100 mcg by mouth daily.     No facility-administered medications prior to visit.   Allergies  Allergen Reactions   Other Anaphylaxis    Covi-19 Vaccine (Pfiser)   Clindamycin/Lincomycin Diarrhea    Severe diarrhea, cannot tolerate   Latex Rash   Nsaids     Due to history of gastric bypass   Erythromycin    Penicillins    Chicken Allergy Other (See Comments)    Gastric bypass surgery, can not digest    Codeine    Morphine And Related Nausea And Vomiting     Objective:   Today's Vitals   10/02/22 1324  BP: 136/87  Temp: 97.9 F (36.6 C)  TempSrc: Temporal  Weight: 221 lb 6.4 oz (100.4 kg)  Height: 5\' 6"  (1.676 m)   Body mass index is 35.73 kg/m.   General: Well developed, well nourished. No acute distress. HEENT: Mucous membranes moist. Oropharynx clear.  Lungs: Clear to auscultation bilaterally. No wheezing, rales or rhonchi. CV: RRR without murmurs or rubs. Pulses 2+ bilaterally. Abdomen: Soft, non-tender. Bowel sounds positive, normal pitch and frequency. No   hepatosplenomegaly. No rebound or guarding. Psych: Alert and oriented. Normal mood and  affect.  There are no preventive care reminders to display for this patient.    Assessment & Plan:   1. Acute gastroenteritis Nancy Mills' history and exam are consistent with a probable viral gastroenteritis. I cannot exclude some overlay related to her current stressors and her prior GI surgery. I reviewed approaches to hydration and diet. I recommend she continue to use the Zofran as needed. I recommend she obtain Imodium to take 4 mg initially, then 2 mg with each diarrheal stool, up to 6 tabs a day. This will likely resolve the GI issues. She can try and return to work on Friday.   Return if symptoms worsen  or fail to improve.   Loyola Mast, MD

## 2022-10-16 ENCOUNTER — Other Ambulatory Visit (HOSPITAL_BASED_OUTPATIENT_CLINIC_OR_DEPARTMENT_OTHER): Payer: Self-pay

## 2022-10-16 ENCOUNTER — Other Ambulatory Visit (HOSPITAL_COMMUNITY): Payer: Self-pay

## 2022-10-21 ENCOUNTER — Encounter (INDEPENDENT_AMBULATORY_CARE_PROVIDER_SITE_OTHER): Payer: 59 | Admitting: Sports Medicine

## 2022-10-21 DIAGNOSIS — M1612 Unilateral primary osteoarthritis, left hip: Secondary | ICD-10-CM | POA: Diagnosis not present

## 2022-10-22 ENCOUNTER — Other Ambulatory Visit: Payer: Self-pay | Admitting: Sports Medicine

## 2022-10-22 DIAGNOSIS — M1612 Unilateral primary osteoarthritis, left hip: Secondary | ICD-10-CM

## 2022-10-23 ENCOUNTER — Other Ambulatory Visit (HOSPITAL_COMMUNITY): Payer: Self-pay

## 2022-10-23 MED ORDER — HYDROCODONE-ACETAMINOPHEN 10-325 MG PO TABS
1.0000 | ORAL_TABLET | Freq: Three times a day (TID) | ORAL | 0 refills | Status: DC | PRN
  Filled 2022-10-23: qty 30, 10d supply, fill #0

## 2022-10-23 MED ORDER — HYDROCODONE-ACETAMINOPHEN 10-325 MG PO TABS: 1.0000 | ORAL_TABLET | Freq: Three times a day (TID) | ORAL | 0 refills | Status: AC | PRN

## 2022-10-23 NOTE — Addendum Note (Signed)
Addended by: Monica Becton on: 10/23/2022 09:03 AM   Modules accepted: Orders

## 2022-10-23 NOTE — Telephone Encounter (Signed)
I spent 5 total minutes of online digital evaluation and management services in this patient-initiated request for online care. 

## 2022-10-30 ENCOUNTER — Telehealth: Payer: 59 | Admitting: Sports Medicine

## 2022-10-31 ENCOUNTER — Other Ambulatory Visit (HOSPITAL_COMMUNITY): Payer: Self-pay

## 2022-11-01 ENCOUNTER — Other Ambulatory Visit (HOSPITAL_COMMUNITY): Payer: Self-pay

## 2022-11-01 ENCOUNTER — Other Ambulatory Visit: Payer: Self-pay

## 2022-11-23 ENCOUNTER — Other Ambulatory Visit: Payer: Self-pay | Admitting: Family Medicine

## 2022-11-23 ENCOUNTER — Other Ambulatory Visit: Payer: Self-pay

## 2022-11-23 ENCOUNTER — Other Ambulatory Visit (HOSPITAL_COMMUNITY): Payer: Self-pay

## 2022-11-23 MED ORDER — ALPRAZOLAM 1 MG PO TABS
1.0000 mg | ORAL_TABLET | Freq: Two times a day (BID) | ORAL | 1 refills | Status: AC | PRN
  Filled 2022-11-23: qty 60, 30d supply, fill #0

## 2022-11-23 MED ORDER — OMEPRAZOLE 20 MG PO CPDR
20.0000 mg | DELAYED_RELEASE_CAPSULE | Freq: Every day | ORAL | 1 refills | Status: AC
  Filled 2022-11-23: qty 30, 30d supply, fill #0
  Filled 2022-12-26: qty 30, 30d supply, fill #1

## 2022-11-23 MED ORDER — VALACYCLOVIR HCL 500 MG PO TABS
1000.0000 mg | ORAL_TABLET | Freq: Every day | ORAL | 1 refills | Status: AC
  Filled 2022-11-23: qty 60, 30d supply, fill #0
  Filled 2022-12-26: qty 60, 30d supply, fill #1

## 2022-11-23 MED ORDER — ONDANSETRON 8 MG PO TBDP
8.0000 mg | ORAL_TABLET | Freq: Three times a day (TID) | ORAL | 0 refills | Status: AC | PRN
  Filled 2022-11-23: qty 90, 30d supply, fill #0

## 2022-11-23 NOTE — Telephone Encounter (Signed)
Last refill 11/22/2021 #180

## 2022-11-27 ENCOUNTER — Other Ambulatory Visit (HOSPITAL_COMMUNITY): Payer: Self-pay

## 2022-11-28 ENCOUNTER — Other Ambulatory Visit (HOSPITAL_COMMUNITY): Payer: Self-pay

## 2022-11-29 ENCOUNTER — Other Ambulatory Visit (HOSPITAL_COMMUNITY): Payer: Self-pay

## 2022-12-26 ENCOUNTER — Other Ambulatory Visit (HOSPITAL_COMMUNITY): Payer: Self-pay

## 2022-12-26 ENCOUNTER — Other Ambulatory Visit: Payer: Self-pay | Admitting: Sports Medicine

## 2022-12-26 ENCOUNTER — Other Ambulatory Visit: Payer: Self-pay

## 2022-12-26 DIAGNOSIS — M5416 Radiculopathy, lumbar region: Secondary | ICD-10-CM

## 2022-12-26 MED ORDER — GABAPENTIN 600 MG PO TABS
1200.0000 mg | ORAL_TABLET | Freq: Three times a day (TID) | ORAL | 1 refills | Status: AC
  Filled 2022-12-26: qty 180, 30d supply, fill #0

## 2022-12-26 MED ORDER — TRAMADOL HCL 50 MG PO TABS
100.0000 mg | ORAL_TABLET | Freq: Three times a day (TID) | ORAL | 2 refills | Status: AC
  Filled 2022-12-26: qty 180, 30d supply, fill #0

## 2023-03-07 ENCOUNTER — Telehealth: Payer: Self-pay

## 2023-03-07 NOTE — Transitions of Care (Post Inpatient/ED Visit) (Signed)
   04/11/2023  Name: Nancy Mills MRN: [id number] DOB: [birth year]  Today's TOC FU Call Status: Today's TOC FU Call Status:: Successful TOC FU Call Competed Unsuccessful Call (1st Attempt) Date: 03/07/23 Patient has moved and now living in [place], [place] she is look for a new PCP.    Attempted to reach the patient regarding the most recent Inpatient/ED visit.  Follow Up Plan:   Signature  Fredirick Maudlin

## 2023-06-06 ENCOUNTER — Other Ambulatory Visit (HOSPITAL_COMMUNITY): Payer: Self-pay

## 2024-07-07 ENCOUNTER — Encounter: Payer: Self-pay | Admitting: Sports Medicine
